# Patient Record
Sex: Male | Born: 1965 | Race: Black or African American | Hispanic: No | Marital: Married | Smoking: Never smoker
Health system: Southern US, Community
[De-identification: ages and names within clinical notes are randomized; demographics above are authoritative.]

## PROBLEM LIST (undated history)

## (undated) DIAGNOSIS — Z86010 Personal history of colonic polyps: Principal | ICD-10-CM

## (undated) DIAGNOSIS — M25562 Pain in left knee: Secondary | ICD-10-CM

## (undated) HISTORY — DX: Pain in left knee: M25.562

## (undated) HISTORY — DX: Personal history of colonic polyps: Z86.010

---

## 1996-10-08 HISTORY — PX: HEMORRHOID SURGERY: SHX153

## 2001-04-02 ENCOUNTER — Encounter: Admission: RE | Admit: 2001-04-02 | Discharge: 2001-04-02 | Payer: Self-pay | Admitting: Occupational Medicine

## 2001-04-02 ENCOUNTER — Encounter: Payer: Self-pay | Admitting: Occupational Medicine

## 2006-09-12 ENCOUNTER — Ambulatory Visit: Payer: Self-pay | Admitting: Family Medicine

## 2006-09-13 ENCOUNTER — Ambulatory Visit: Payer: Self-pay | Admitting: Family Medicine

## 2006-09-26 HISTORY — PX: VASECTOMY: SHX75

## 2006-09-27 ENCOUNTER — Ambulatory Visit: Payer: Self-pay | Admitting: Family Medicine

## 2006-09-27 ENCOUNTER — Encounter (INDEPENDENT_AMBULATORY_CARE_PROVIDER_SITE_OTHER): Payer: Self-pay | Admitting: Specialist

## 2006-10-25 ENCOUNTER — Ambulatory Visit: Payer: Self-pay | Admitting: Family Medicine

## 2007-06-12 ENCOUNTER — Ambulatory Visit: Payer: Self-pay | Admitting: Family Medicine

## 2007-06-12 LAB — CONVERTED CEMR LAB
ALT: 14 units/L (ref 0–53)
AST: 23 units/L (ref 0–37)
Albumin: 4.1 g/dL (ref 3.5–5.2)
Alkaline Phosphatase: 68 units/L (ref 39–117)
BUN: 11 mg/dL (ref 6–23)
Bilirubin, Direct: 0.2 mg/dL (ref 0.0–0.3)
CO2: 33 meq/L — ABNORMAL HIGH (ref 19–32)
Calcium: 9.8 mg/dL (ref 8.4–10.5)
Chloride: 107 meq/L (ref 96–112)
Cholesterol: 170 mg/dL (ref 0–200)
Creatinine, Ser: 1 mg/dL (ref 0.4–1.5)
GFR calc Af Amer: 105 mL/min
GFR calc non Af Amer: 87 mL/min
Glucose, Bld: 66 mg/dL — ABNORMAL LOW (ref 70–99)
HDL: 42.9 mg/dL (ref >39.0)
LDL Cholesterol: 115 mg/dL — ABNORMAL HIGH (ref 0–99)
Potassium: 3.6 meq/L (ref 3.5–5.1)
Sodium: 146 meq/L — ABNORMAL HIGH (ref 135–145)
TSH: 0.64 microintl units/mL (ref 0.35–5.50)
Total Bilirubin: 1.6 mg/dL — ABNORMAL HIGH (ref 0.3–1.2)
Total CHOL/HDL Ratio: 4
Total Protein: 7 g/dL (ref 6.0–8.3)
Triglycerides: 61 mg/dL (ref 0–149)
VLDL: 12 mg/dL (ref 0–40)

## 2008-06-17 ENCOUNTER — Ambulatory Visit: Payer: Self-pay | Admitting: Family Medicine

## 2008-06-17 DIAGNOSIS — H6120 Impacted cerumen, unspecified ear: Secondary | ICD-10-CM | POA: Insufficient documentation

## 2008-06-17 DIAGNOSIS — B070 Plantar wart: Secondary | ICD-10-CM | POA: Insufficient documentation

## 2008-06-18 LAB — CONVERTED CEMR LAB
BUN: 11 mg/dL (ref 6–23)
CO2: 31 meq/L (ref 19–32)
Calcium: 10 mg/dL (ref 8.4–10.5)
Chloride: 101 meq/L (ref 96–112)
Cholesterol: 170 mg/dL (ref 0–200)
Creatinine, Ser: 1.2 mg/dL (ref 0.4–1.5)
GFR calc Af Amer: 85 mL/min
GFR calc non Af Amer: 70 mL/min
Glucose, Bld: 81 mg/dL (ref 70–99)
HDL: 46.8 mg/dL (ref >39.0)
LDL Cholesterol: 109 mg/dL — ABNORMAL HIGH (ref 0–99)
PSA: 0.51 ng/mL (ref 0.10–4.00)
Potassium: 4.5 meq/L (ref 3.5–5.1)
Sodium: 137 meq/L (ref 135–145)
Total CHOL/HDL Ratio: 3.6
Triglycerides: 72 mg/dL (ref 0–149)
VLDL: 14 mg/dL (ref 0–40)

## 2009-06-30 ENCOUNTER — Ambulatory Visit: Payer: Self-pay | Admitting: Family Medicine

## 2009-06-30 LAB — CONVERTED CEMR LAB
ALT: 20 units/L (ref 0–53)
AST: 25 units/L (ref 0–37)
Albumin: 4.1 g/dL (ref 3.5–5.2)
Alkaline Phosphatase: 82 units/L (ref 39–117)
BUN: 15 mg/dL (ref 6–23)
Bilirubin, Direct: 0.1 mg/dL (ref 0.0–0.3)
CO2: 29 meq/L (ref 19–32)
Calcium: 9.2 mg/dL (ref 8.4–10.5)
Chloride: 104 meq/L (ref 96–112)
Cholesterol: 156 mg/dL (ref 0–200)
Creatinine, Ser: 1.2 mg/dL (ref 0.4–1.5)
GFR calc non Af Amer: 84.23 mL/min (ref >60)
Glucose, Bld: 68 mg/dL — ABNORMAL LOW (ref 70–99)
HDL: 39.2 mg/dL (ref >39.00)
LDL Cholesterol: 101 mg/dL — ABNORMAL HIGH (ref 0–99)
PSA: 0.47 ng/mL (ref 0.10–4.00)
Potassium: 3.3 meq/L — ABNORMAL LOW (ref 3.5–5.1)
Sodium: 140 meq/L (ref 135–145)
TSH: 0.88 microintl units/mL (ref 0.35–5.50)
Total Bilirubin: 1.4 mg/dL — ABNORMAL HIGH (ref 0.3–1.2)
Total CHOL/HDL Ratio: 4
Total Protein: 6.9 g/dL (ref 6.0–8.3)
Triglycerides: 80 mg/dL (ref 0.0–149.0)
VLDL: 16 mg/dL (ref 0.0–40.0)

## 2009-07-04 ENCOUNTER — Ambulatory Visit: Payer: Self-pay | Admitting: Family Medicine

## 2010-05-16 ENCOUNTER — Encounter (INDEPENDENT_AMBULATORY_CARE_PROVIDER_SITE_OTHER): Payer: Self-pay | Admitting: *Deleted

## 2010-06-27 ENCOUNTER — Telehealth (INDEPENDENT_AMBULATORY_CARE_PROVIDER_SITE_OTHER): Payer: Self-pay | Admitting: *Deleted

## 2010-06-29 ENCOUNTER — Ambulatory Visit: Payer: Self-pay | Admitting: Family Medicine

## 2010-06-30 LAB — CONVERTED CEMR LAB
Glucose, Bld: 75 mg/dL (ref 70–99)
PSA: 0.55 ng/mL (ref 0.10–4.00)

## 2010-07-17 ENCOUNTER — Ambulatory Visit: Payer: Self-pay | Admitting: Family Medicine

## 2010-07-17 DIAGNOSIS — M25569 Pain in unspecified knee: Secondary | ICD-10-CM | POA: Insufficient documentation

## 2010-11-09 NOTE — Assessment & Plan Note (Signed)
Summary: CPX/JRR   Vital Signs:  Patient profile:   45 year old male Weight:      146.25 pounds BMI:     21.68 Temp:     98.5 degrees F oral Pulse rate:   72 / minute Pulse rhythm:   regular BP sitting:   118 / 76  (left arm) Cuff size:   regular  Vitals Entered By: Sydell Axon LPN (July 17, 2010 2:39 PM) CC: 30 Minute checkup   History of Present Illness: CPE- see plan.  L knee pain.  New symptoms.  going on for about 2 weeks.  Got puffy a few days ago, down some now.  Pain with walking, pain on medial side.  No popping, clicking, no giving out.  Limited ROM- unable to completely flex the knee.  O/w with normal range of motion   Allergies: No Known Drug Allergies  Past History:  Past Surgical History: Last updated: 05/28/2007 Hemmorhoidectomy 1998 Vasectomy Hetty Ely) 09/26/06  Past Medical History: otherwise healthy  Family History: Reviewed history from 07/04/2009 and no changes required. Father: dec 2010 65  [place] Had Chemo Unknown type of Cancer  DM (7/09)--CVA, ETOH abuse Mother: dec 62 Ca, bone  (7/08) Sister, little contact, Asthma CV - none HBP - none DM - none Breast/Ovarian/Uterine Cancer - none Depression - none ETOH Abuse + Father Stroke - none  Social History: Reviewed history from 07/04/2009 and no changes required. 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  Otherwise negative.    Physical Exam  General:  GEN: nad, alert and oriented HEENT: mucous membranes moist NECK: supple w/o LA CV: rrr.  no murmur PULM: ctab, no inc wob ABD: soft, +bs EXT: no edema SKIN: no acute rash  Rectal exam with heme neg stool, normal prostate exam- not tender to palpation, no asymmetry, no nodularity L Knee: slighly puffy, no erythema, normal ext, flex  slightly limited by pain.  tender to palpation medially, no tender to palpation laterally. ACL/MCL/LCL/PCL intact.  no meniscal findings.  normal patellar tracking w/o pain on compression   Impression & Recommendations:  Problem # 1:  HEALTH MAINTENANCE EXAM (ICD-V70.0) d/w patient.  flu shot today.  tdap up to date. d/w patient [id number] diet and exercise.  lipids wnl last year.  won't need PSA/rectal until 48 unless symptoms develop.   Problem # 2:  KNEE PAIN, LEFT (ICD-719.46) Likely OA flare w/o acute abnormality that would need imaging today.  Able to bear weight.  Would use otc pain meds and a knee sleeve.  Pt to follow this and follow up here if continued/increased.  He agrees.    Other Orders: Admin 1st Vaccine (40981) Flu Vaccine 37yrs + [id number])  Patient Instructions: 1)  Glad to see you today.   2)  I would get a knee sleeve at the grocery store or pharmacy.  Wear it during the day.  Take 2 regular tylenol three times a day as needed for pain.  Let me know if the swelling or pain doesn't get better.   3)  Take care.   Prior Medications (reviewed today): None Current Allergies (reviewed today): No known allergies    Flu Vaccine Consent Questions     Do you have a history of severe allergic reactions to this vaccine? no  Any prior history of allergic reactions to egg and/or gelatin? no    Do you have a sensitivity to the preservative Thimersol? no    Do you have a past history of Guillan-Barre Syndrome? no    Do you currently have an acute febrile illness? no    Have you ever had a severe reaction to latex? no    Vaccine information given and explained to patient? yes    Are you currently pregnant? no    Lot Number:[id number]   Exp Date:04/07/2011   Site Given  Left Deltoid IMlbflu

## 2010-11-09 NOTE — Progress Notes (Signed)
----   Converted from flag ---- ---- 06/26/2010 1:16 PM, Crawford Givens MD wrote: glucose (v70.0) and PSA (v76.44)  ---- 06/26/2010 12:17 PM, Liane Comber CMA (AAMA) wrote: Lab orders please! Good Morning! This pt is scheduled for cpx labs [place], which labs to draw and dx codes to use? Thanks Tasha ------------------------------

## 2010-11-09 NOTE — Letter (Signed)
Summary: Nadara Eaton letter  [facility]  [address] [place], [place] [address]   Phone: [phone]  Fax: [phone]       05/16/2010 MRN: [id number]  [facility] [address] [place], [place]  [address]  Dear Mr. Cataldi,  [facility] [place], and [place] announce the retirement of Arta Silence, M.D., from full-time practice at the [facility] office effective April 06, 2010 and his plans of returning part-time.  It is important to Dr. Hetty Ely and to our practice that you understand that [facility] has seven physicians in our office for your health care needs.  We will continue to offer the same exceptional care that you have today.    Dr. Hetty Ely has spoken to many of you about his plans for retirement and returning part-time in the fall.   We will continue to work with you through the transition to schedule appointments for you in the office and meet the high standards that [place] is committed to.   Again, it is with great pleasure that we share the news that Dr. Hetty Ely will return to [facility] in October of 2011 with a reduced schedule.    If you have any questions, or would like to request an appointment with one of our physicians, please call us at [phone] and press the option for Scheduling an appointment.  We take pleasure in providing you with excellent patient care and look forward to seeing you at your next office visit.  Our [facility] Physicians are:  Tillman Abide, M.D. Laurita Quint, M.D. Roxy Manns, M.D. Kerby Nora, M.D. Hannah Beat, M.D. Ruthe Mannan, M.D. We proudly welcomed Raechel Ache, M.D. and Eustaquio Boyden, M.D. to the practice in July/August 2011.  Sincerely,  [facility]

## 2010-12-22 ENCOUNTER — Ambulatory Visit (INDEPENDENT_AMBULATORY_CARE_PROVIDER_SITE_OTHER)
Admission: RE | Admit: 2010-12-22 | Discharge: 2010-12-22 | Disposition: A | Discharge status: Home / Self Care | Payer: Managed Care, Other (non HMO) | Source: Ambulatory Visit | Attending: Family Medicine | Admitting: Family Medicine

## 2010-12-22 ENCOUNTER — Other Ambulatory Visit: Payer: Self-pay | Admitting: Family Medicine

## 2010-12-22 ENCOUNTER — Encounter: Payer: Self-pay | Admitting: Family Medicine

## 2010-12-22 ENCOUNTER — Ambulatory Visit (INDEPENDENT_AMBULATORY_CARE_PROVIDER_SITE_OTHER): Payer: Managed Care, Other (non HMO) | Admitting: Family Medicine

## 2010-12-22 DIAGNOSIS — M25569 Pain in unspecified knee: Secondary | ICD-10-CM

## 2010-12-26 NOTE — Letter (Signed)
Summary: Generic Letter  [facility]  [address] [place], [place] [address]   Phone: [phone]  Fax: [phone]    12/22/2010  MITCHAEL LUCKEY [address] [place], [place]  [address]  [place]  To whom it may concern,  Please allow the patient to work without wearing steeltoe boots.     Sincerely,   Crawford Givens MD

## 2011-01-04 NOTE — Assessment & Plan Note (Signed)
Summary: KNEE PAIN/CLE  [employer]   Vital Signs:  Patient profile:   45 year old male Height:      69 inches Weight:      151.25 pounds BMI:     22.42 Temp:     98.3 degrees F oral Pulse rate:   68 / minute Pulse rhythm:   regular BP sitting:   116 / 76  (left arm) Cuff size:   regular  Vitals Entered By: Delilah Shan CMA Duncan Dull) (December 22, 2010 9:39 AM) CC: Knee pain   History of Present Illness: L knee pain- he got some better with a knee sleeve but the pain returned.  Pain started getting worse in last month.  No trauma, no trigger known.  Pain worse at night, early AM and after getting up from sitting.  Sitting for brief time helps with the pain.   It had been puffy prev.  No locking, no popping.  Trouble with full flexion.  Minimal help with aleve.    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  Otherwise negative.    Physical Exam  General:  no apparent distress L knee with normal inspection, not puffy or bruised.  normal range of motion but pain with extreme of flexion.  tender to palpation on medial joint line.  no patellar clicks.  ligamentously intact.  not tender to palpation on lateral joint line.    Impression & Recommendations:  Problem # 1:  KNEE PAIN, LEFT (ICD-719.46) d/w patient.  Likely OA flare.  No neniscal click or locking.  I would treat with tramadol and if not improved, he'll call back and we'll set up ortho eval.  He agrees.  Orders: T-Knee Left 2 view (73560TC) Prescription Created Electronically [phone])  His updated medication list for this problem includes:    Tramadol Hcl 50 Mg Tabs (Tramadol hcl) .Marland Kitchen... 1 by mouth three times a day as needed for pain  Complete Medication List: 1)  Tramadol Hcl 50  Mg Tabs (Tramadol hcl) .Marland Kitchen.. 1 by mouth three times a day as needed for pain  Patient Instructions: 1)  Take the tramadol three times a day as needed for pain.  It can make you drowsy.  Let me know if it doesn't help with the pain.  If you aren't improved, then we may need to get you set up with the ortho clinic.  Take care.   Prescriptions: TRAMADOL HCL 50 MG TABS (TRAMADOL HCL) 1 by mouth three times a day as needed for pain  #40 x 2   Entered and Authorized by:   Crawford Givens MD   Signed by:   Crawford Givens MD on 12/22/2010   Method used:   Electronically to        [facility]* (retail)       [address]       [place], [place]  [address]       Ph: [phone]       Fax: [phone]   RxID:   [id number]    Orders Added: 1)  T-Knee Left 2 view [73560TC] 2)  Est. Patient Level III [56387] 3)  Prescription Created Electronically [phone]    Prior Medications (reviewed today): None Current Allergies (reviewed today): No known  allergies

## 2011-07-13 ENCOUNTER — Other Ambulatory Visit: Payer: Self-pay | Admitting: Family Medicine

## 2011-07-17 ENCOUNTER — Other Ambulatory Visit: Payer: Managed Care, Other (non HMO)

## 2011-07-17 ENCOUNTER — Other Ambulatory Visit (INDEPENDENT_AMBULATORY_CARE_PROVIDER_SITE_OTHER): Payer: BC Managed Care – PPO

## 2011-07-17 DIAGNOSIS — R7309 Other abnormal glucose: Secondary | ICD-10-CM

## 2011-07-18 ENCOUNTER — Encounter: Payer: Self-pay | Admitting: Family Medicine

## 2011-07-26 ENCOUNTER — Ambulatory Visit (INDEPENDENT_AMBULATORY_CARE_PROVIDER_SITE_OTHER): Payer: BC Managed Care – PPO | Admitting: Family Medicine

## 2011-07-26 ENCOUNTER — Encounter: Payer: Self-pay | Admitting: Family Medicine

## 2011-07-26 VITALS — BP 106/80 | HR 64 | Temp 97.8°F | Wt 150.1 lb

## 2011-07-26 DIAGNOSIS — Z0000 Encounter for general adult medical examination without abnormal findings: Secondary | ICD-10-CM

## 2011-07-26 DIAGNOSIS — Z23 Encounter for immunization: Secondary | ICD-10-CM

## 2011-07-26 NOTE — Progress Notes (Signed)
CPE- See plan.  Routine anticipatory guidance given to patient.  See health maintenance.  PMH and SH reviewed  Meds, vitals, and allergies reviewed.   ROS: See HPI.  Otherwise negative.    GEN: nad, alert and oriented HEENT: mucous membranes moist NECK: supple w/o LA CV: rrr. PULM: ctab, no inc wob ABD: soft, +bs EXT: no edema SKIN: no acute rash

## 2011-07-26 NOTE — Patient Instructions (Signed)
Keep exercising and take care.  Glad to see you.  Let me know if you have any concerns.  I would get another flu shot next fall at a physical.

## 2011-07-27 DIAGNOSIS — Z0000 Encounter for general adult medical examination without abnormal findings: Secondary | ICD-10-CM | POA: Insufficient documentation

## 2011-07-27 NOTE — Assessment & Plan Note (Signed)
Doing well.  Healthy habits encouraged.  Flu shot done today.  Sugar wnl.  No need for early prostate/colon cancer screening.  He agrees.  F/u prn.

## 2012-04-17 ENCOUNTER — Ambulatory Visit (INDEPENDENT_AMBULATORY_CARE_PROVIDER_SITE_OTHER): Payer: BC Managed Care – PPO | Admitting: Family Medicine

## 2012-04-17 ENCOUNTER — Encounter: Payer: Self-pay | Admitting: Family Medicine

## 2012-04-17 VITALS — BP 116/72 | HR 64 | Temp 97.9°F | Ht 68.75 in | Wt 150.8 lb

## 2012-04-17 DIAGNOSIS — M542 Cervicalgia: Secondary | ICD-10-CM

## 2012-04-17 NOTE — Assessment & Plan Note (Signed)
Likely muscle strain, no need to image.  Anatomy d/w pt, reassured about normal spinous process.  Use topical capsaicin, stretch and Gi caution on aleve.  F/u prn.

## 2012-04-17 NOTE — Patient Instructions (Addendum)
Check your pillow, stretch your neck and use either the cream or the heating pad.  This should get better.  Take aleve with food.

## 2012-04-17 NOTE — Progress Notes (Signed)
Started about 2 weeks ago. Had been doing some heavy lifting at work.  Pain gradually got worse.  He didn't remember a specific injury.  No pain now, but "it has it's moments."  Pain with certain movements.  Capsaicin helps some along with aleve.  He noted a bump on the back of his neck  Meds, vitals, and allergies reviewed.   ROS: See HPI.  Otherwise, noncontributory.  nad ncat Neck supple, normal ROM rrr ctab Neck and back not ttp and the bump he noted is the spinous process of C7.  No rash Normal rom in the shoulders

## 2012-05-19 IMAGING — CR DG KNEE 1-2V*L*
3 series · 3 of 3 positions shown · non-contrast
Comparison: None.

CLINICAL DATA: Left knee pain for 1 month.  No history of injury.

LEFT KNEE - 1-2 VIEW

[view not recorded (1 of 3)]
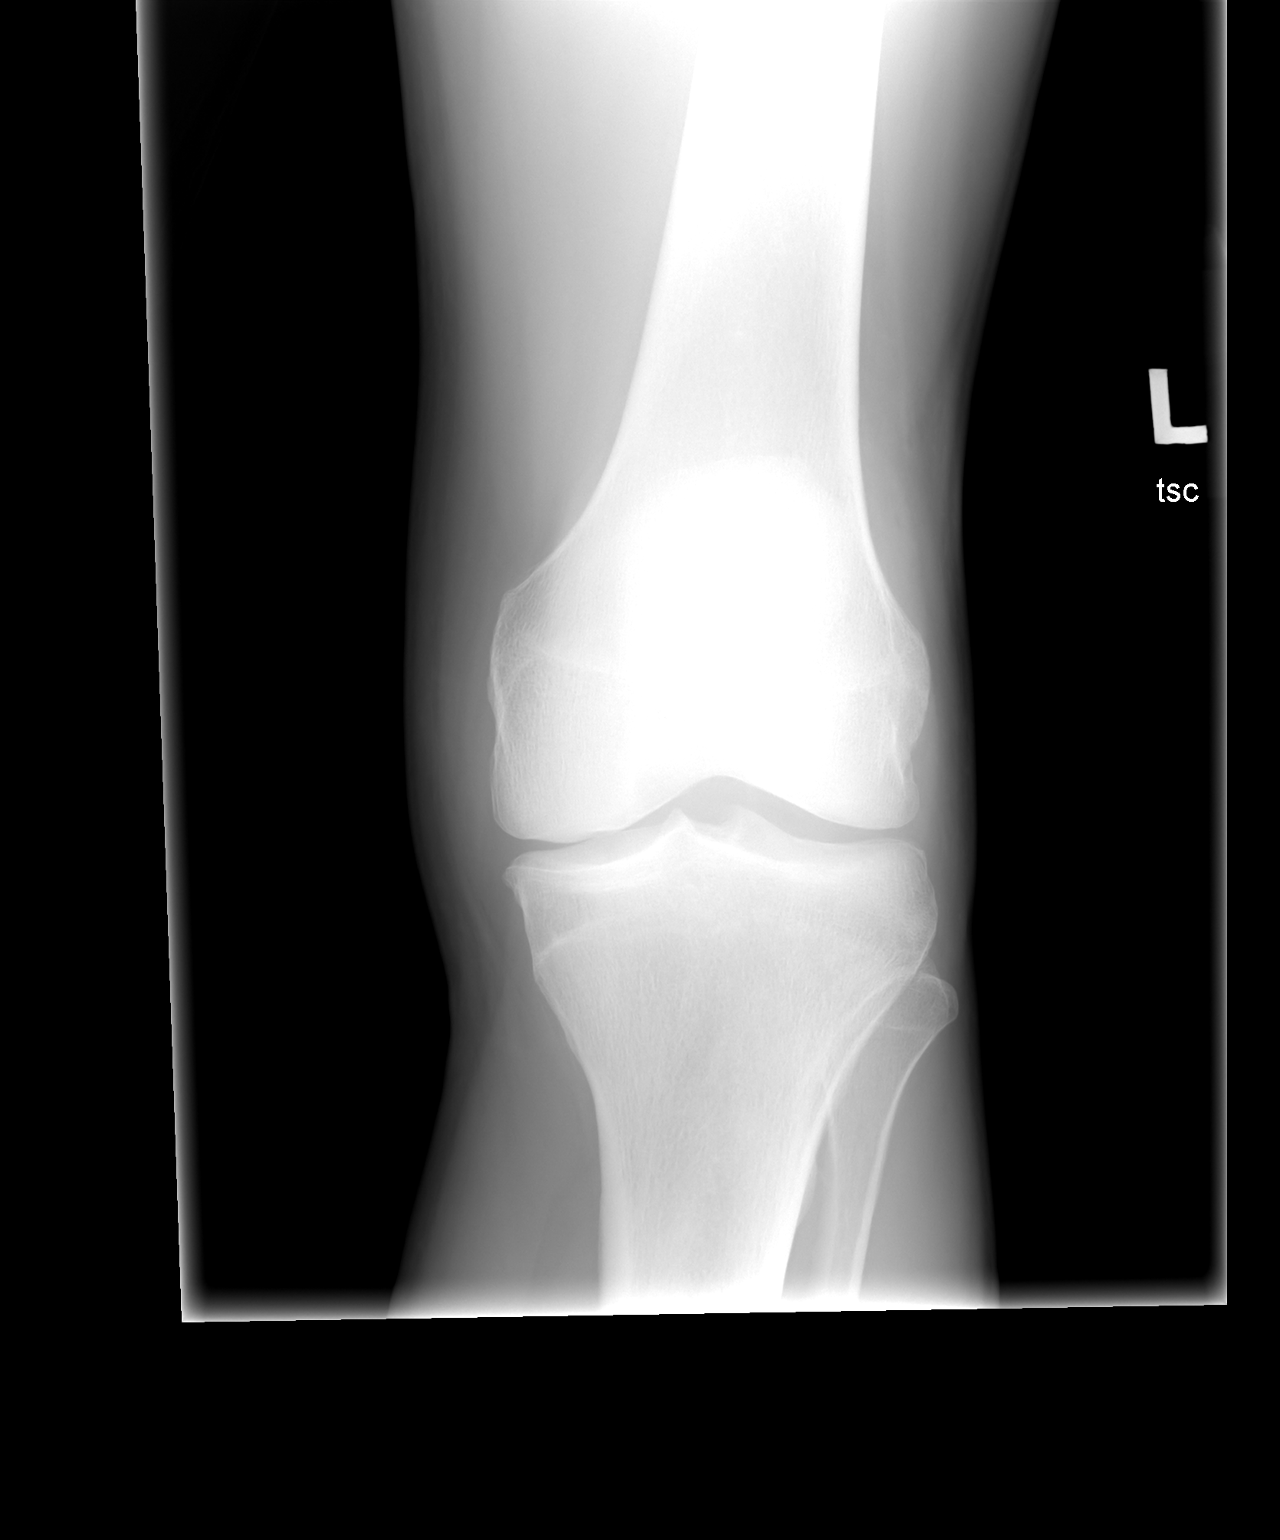

[view not recorded (2 of 3)]
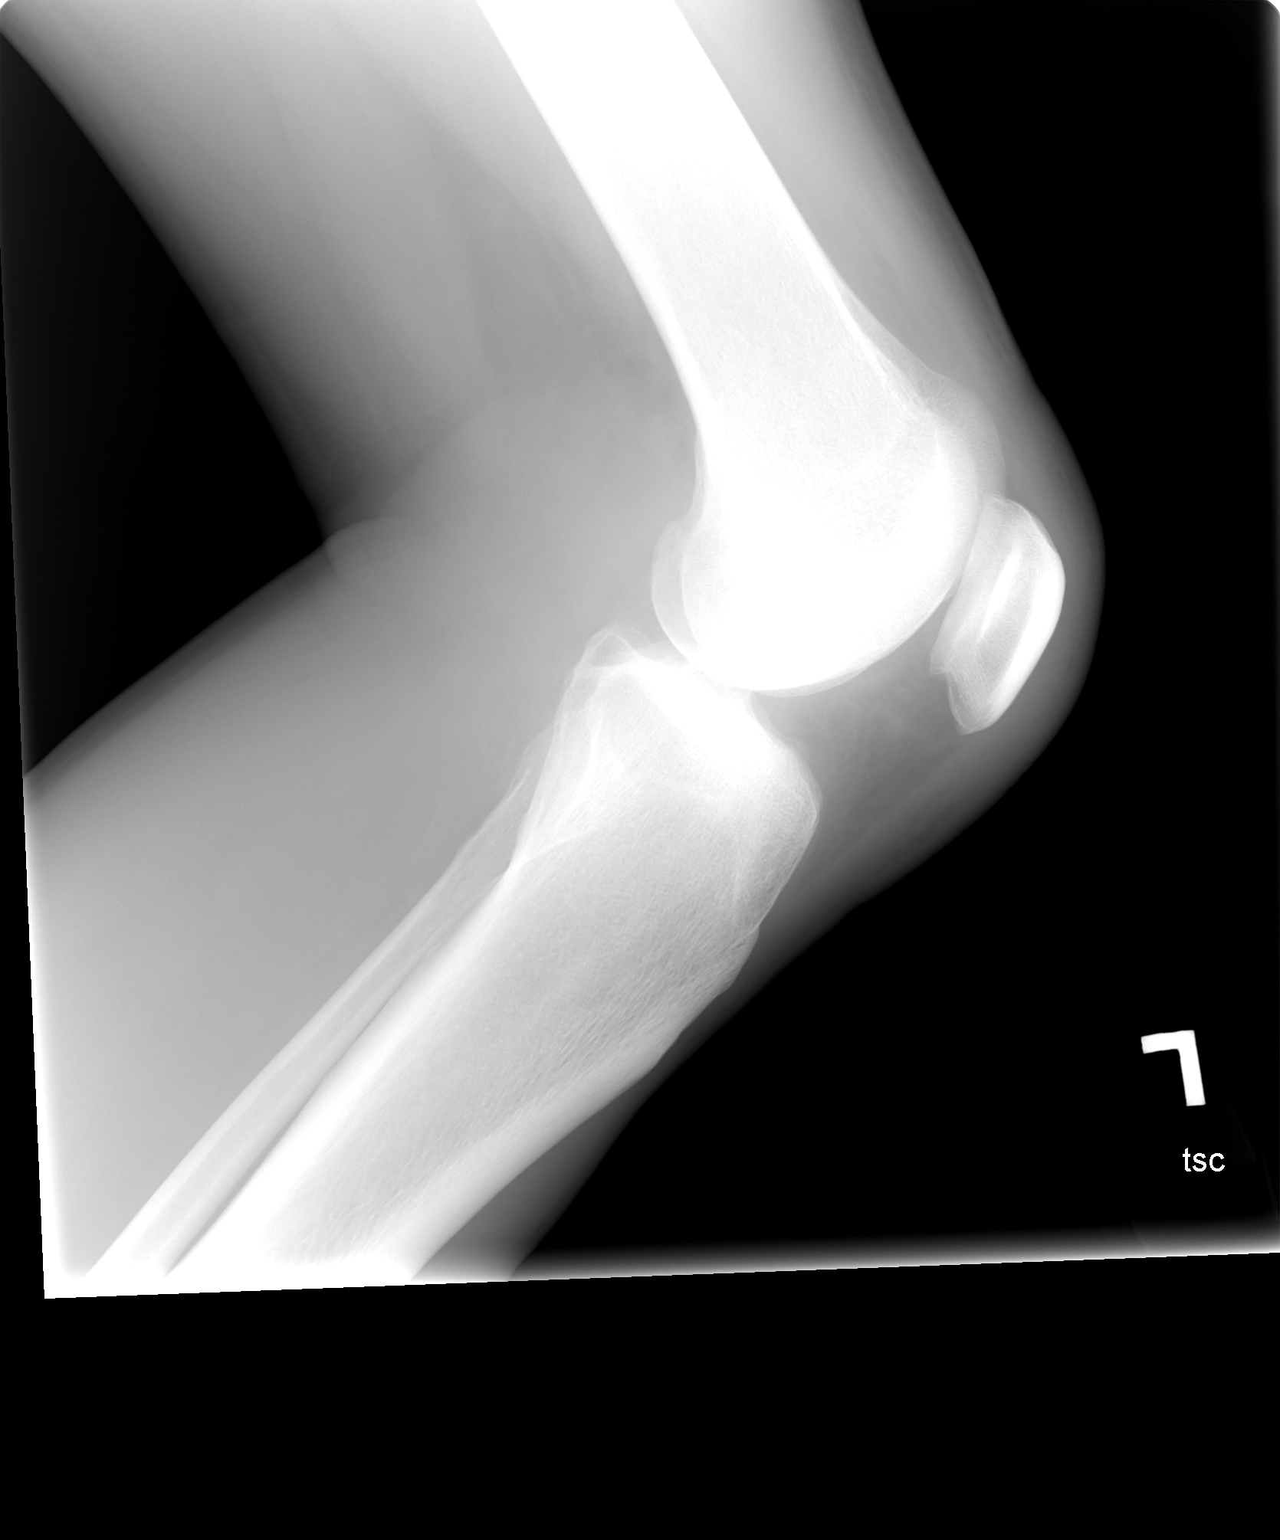

[view not recorded (3 of 3)]
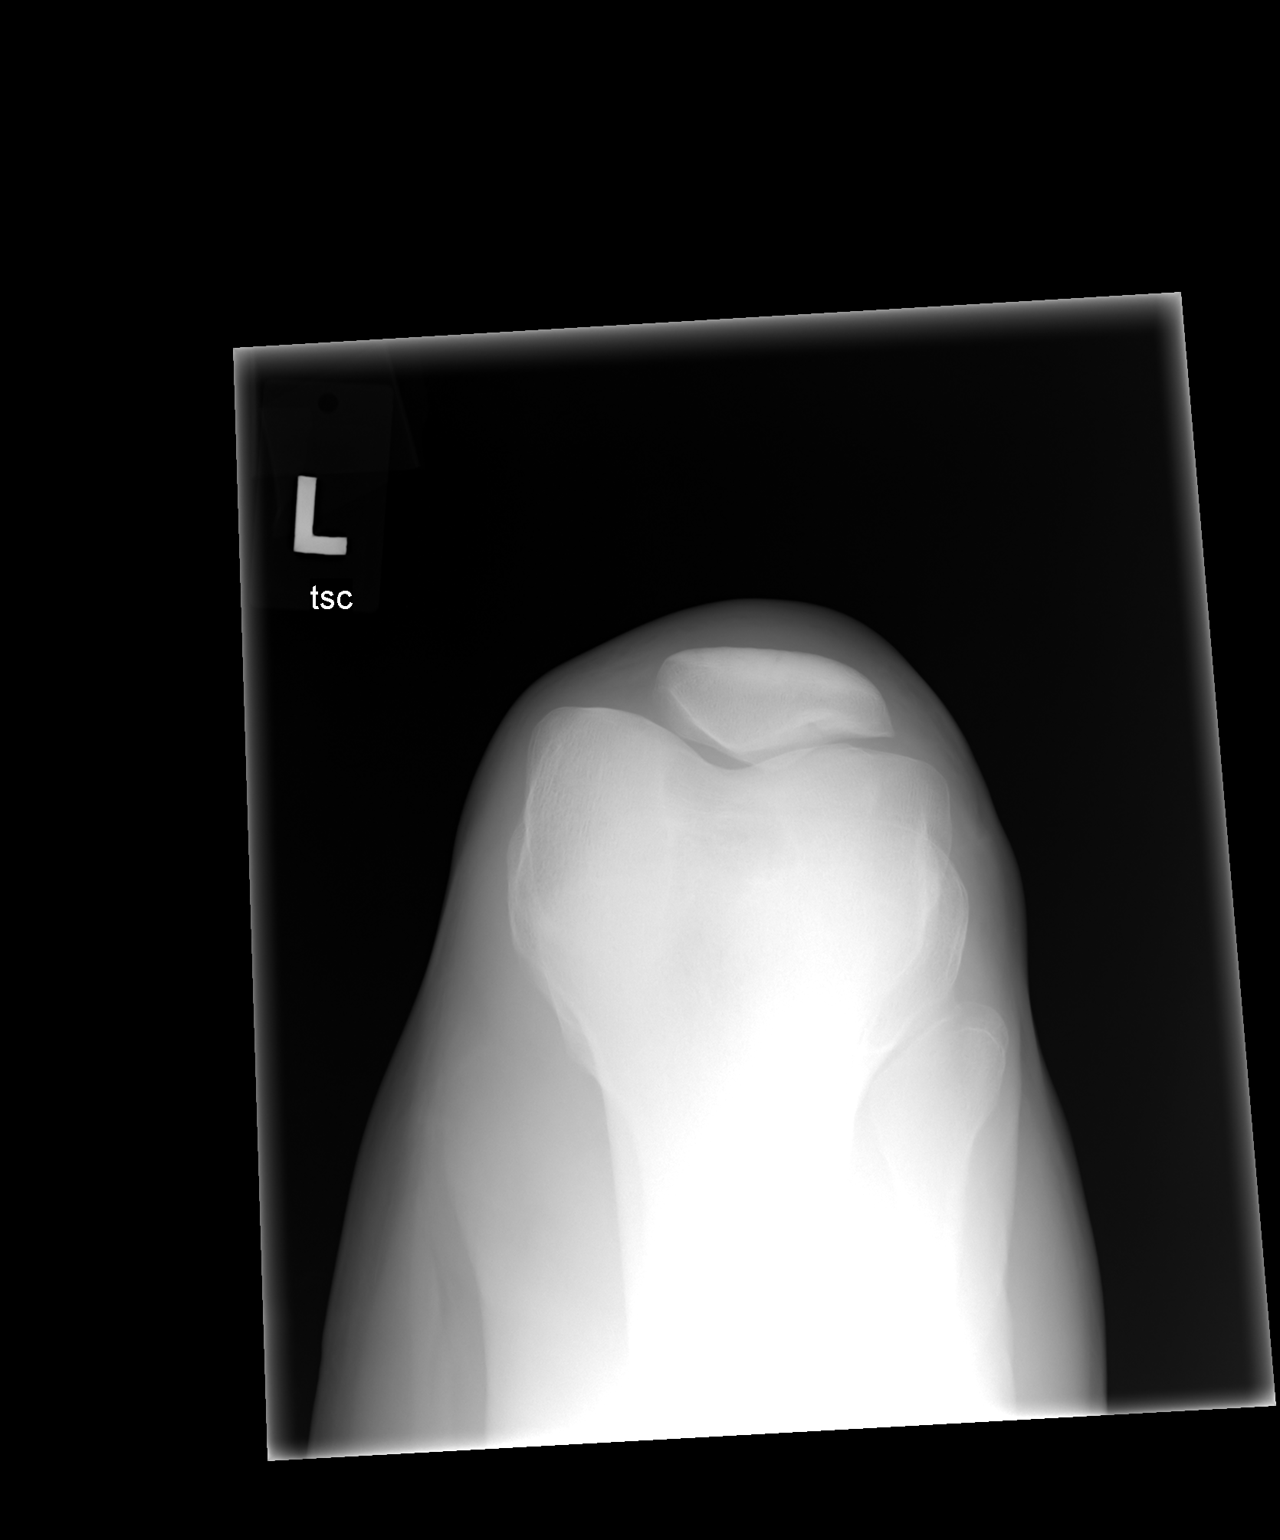

[3 of 3 positions shown; findings below may reference images not displayed]

FINDINGS: No joint effusion is evident. Alignment is normal.  Joint
spaces are preserved.  No fracture or dislocation is evident.  No
soft tissue lesions are seen.  No chondrocalcinosis is seen.
IMPRESSION: No left knee abnormality is demonstrated.

## 2012-07-24 ENCOUNTER — Other Ambulatory Visit: Payer: BC Managed Care – PPO

## 2012-07-29 ENCOUNTER — Ambulatory Visit (INDEPENDENT_AMBULATORY_CARE_PROVIDER_SITE_OTHER): Payer: BC Managed Care – PPO | Admitting: Family Medicine

## 2012-07-29 ENCOUNTER — Encounter: Payer: Self-pay | Admitting: Family Medicine

## 2012-07-29 VITALS — BP 102/68 | HR 75 | Temp 98.1°F | Ht 69.0 in | Wt 149.8 lb

## 2012-07-29 DIAGNOSIS — Z0000 Encounter for general adult medical examination without abnormal findings: Secondary | ICD-10-CM

## 2012-07-29 DIAGNOSIS — Z833 Family history of diabetes mellitus: Secondary | ICD-10-CM

## 2012-07-29 DIAGNOSIS — Z23 Encounter for immunization: Secondary | ICD-10-CM

## 2012-07-29 NOTE — Assessment & Plan Note (Signed)
Routine anticipatory guidance given to patient.  See health maintenance. Tetanus 2009 Flu shot done 2013 No need for early prostate/colon cancer screening, d/w pt about this and he agreed.  Sugar checked today to screen for DM2- normal.  Lipids prev wnl, discussed.  BP wnl.  Diet and exercise discussed.  Living will encouraged, pt to consider.

## 2012-07-29 NOTE — Patient Instructions (Signed)
Take care.  Glad to see you.  I would get flu shot again next fall.

## 2012-07-29 NOTE — Progress Notes (Signed)
CPE- See plan.  Routine anticipatory guidance given to patient.  See health maintenance. Tetanus 2009 Flu shot done 2013 No need for early prostate/colon cancer screening, d/w pt about this and he agreed.  Sugar checked today to screen for DM2- normal.  Lipids prev wnl, discussed.  BP wnl.  Diet and exercise discussed.  Living will encouraged, pt to consider.   PMH and SH reviewed  Meds, vitals, and allergies reviewed.   ROS: See HPI.  Otherwise negative.    GEN: nad, alert and oriented HEENT: mucous membranes moist NECK: supple w/o LA CV: rrr. PULM: ctab, no inc wob ABD: soft, +bs EXT: no edema SKIN: no acute rash

## 2012-10-30 ENCOUNTER — Ambulatory Visit: Payer: BC Managed Care – PPO | Admitting: Family Medicine

## 2013-08-09 ENCOUNTER — Other Ambulatory Visit: Payer: Self-pay | Admitting: Family Medicine

## 2013-08-09 DIAGNOSIS — Z131 Encounter for screening for diabetes mellitus: Secondary | ICD-10-CM

## 2013-08-10 ENCOUNTER — Other Ambulatory Visit: Payer: Self-pay | Admitting: Family Medicine

## 2013-08-11 ENCOUNTER — Other Ambulatory Visit (INDEPENDENT_AMBULATORY_CARE_PROVIDER_SITE_OTHER): Payer: BC Managed Care – PPO

## 2013-08-11 DIAGNOSIS — Z131 Encounter for screening for diabetes mellitus: Secondary | ICD-10-CM

## 2013-08-20 ENCOUNTER — Ambulatory Visit (INDEPENDENT_AMBULATORY_CARE_PROVIDER_SITE_OTHER): Payer: BC Managed Care – PPO | Admitting: Internal Medicine

## 2013-08-20 ENCOUNTER — Encounter: Payer: Self-pay | Admitting: Internal Medicine

## 2013-08-20 VITALS — BP 108/68 | HR 60 | Temp 98.2°F | Ht 68.25 in | Wt 149.0 lb

## 2013-08-20 DIAGNOSIS — Z23 Encounter for immunization: Secondary | ICD-10-CM

## 2013-08-20 DIAGNOSIS — Z0000 Encounter for general adult medical examination without abnormal findings: Secondary | ICD-10-CM

## 2013-08-20 NOTE — Progress Notes (Signed)
Subjective:    Patient ID: Greg Anes., male    DOB: 02/16/1966, 47 y.o.   MRN: [id number]  HPI  Pt presents to the clinic today for his annual exam. He has no concerns today.  Flu: Today Tetanus: 2009 Eye Doctor: yearly Dentist: yearly  Review of Systems      Past Medical History  Diagnosis Date  . Knee pain, left     prev responsive to tramadol    No current outpatient prescriptions on file.   No current facility-administered medications for this visit.    No Known Allergies  Family History  Problem Relation Age of Onset  . Cancer Mother     bone, (7/08)  . Cancer Father     unknown type of CA, DM, CVA, ETOH abuse  . Diabetes Father     7/09  . Stroke Father   . Alcohol abuse Father   . Asthma Sister   . Heart disease Neg Hx   . Hypertension Neg Hx   . Depression Neg Hx   . Prostate cancer Neg Hx   . Colon cancer Neg Hx     History   Social History  . Marital Status: Married    Spouse Name: N/A    Number of Children: 1  . Years of Education: N/A   Occupational History  . [employer] conditioning units and ductwork, eqpt.   Social History Main Topics  . Smoking status: Never Smoker   . Smokeless tobacco: Never Used  . Alcohol Use: Yes     Comment: Rare  . Drug Use: No  . Sexual Activity: Not on file   Other Topics Concern  . Not on file   Social History Narrative   Married 1994   One son at home   Exercise:  At work (at [employer] and air)   Enjoys watching car races (Therapist, sports), car shows     Constitutional: Denies fever, malaise, fatigue, headache or abrupt weight changes.  HEENT: Denies eye pain, eye redness, ear pain, ringing in the ears, wax buildup, runny nose, nasal congestion, bloody nose, or sore throat. Respiratory: Denies difficulty breathing, shortness of breath, cough or sputum production.   Cardiovascular: Denies chest pain, chest tightness, palpitations or swelling in the hands or feet.   Gastrointestinal: Denies abdominal pain, bloating, constipation, diarrhea or blood in the stool.  GU: Denies urgency, frequency, pain with urination, burning sensation, blood in urine, odor or discharge. Musculoskeletal: Denies decrease in range of motion, difficulty with gait, muscle pain or joint pain and swelling.  Skin: Denies redness, rashes, lesions or ulcercations.  Neurological: Denies dizziness, difficulty with memory, difficulty with speech or problems with balance and coordination.   No other specific complaints in a complete review of systems (except as listed in HPI above).  Objective:   Physical Exam  BP 108/68  Pulse 60  Temp(Src) 98.2 F (36.8 C) (Oral)  Ht 5' 8.25" (1.734 m)  Wt 149 lb (67.586 kg)  BMI 22.48 kg/m2  SpO2 98% Wt Readings from Last 3 Encounters:  08/20/13 149 lb (67.586 kg)  07/29/12 149 lb 12 oz (67.926 kg)  04/17/12 150 lb 12 oz (68.38 kg)    General: Appears his stated age, well developed, well nourished in NAD. Skin: Warm, dry and intact. No rashes, lesions or ulcerations noted. HEENT: Head: normal shape and size; Eyes: sclera white, no icterus, conjunctiva pink, PERRLA and EOMs intact; Ears: Tm's  gray and intact, normal light reflex; Nose: mucosa pink and moist, septum midline; Throat/Mouth: Teeth present, mucosa pink and moist, no exudate, lesions or ulcerations noted.  Neck: Normal range of motion. Neck supple, trachea midline. No massses, lumps or thyromegaly present.  Cardiovascular: Normal rate and rhythm. S1,S2 noted.  No murmur, rubs or gallops noted. No JVD or BLE edema. No carotid bruits noted. Pulmonary/Chest: Normal effort and positive vesicular breath sounds. No respiratory distress. No wheezes, rales or ronchi noted.  Abdomen: Soft and nontender. Normal bowel sounds, no bruits noted. No distention or masses noted. Liver, spleen and kidneys non palpable. Musculoskeletal: Normal range of motion. No signs of joint swelling. No difficulty  with gait.  Neurological: Alert and oriented. Cranial nerves II-XII intact. Coordination normal. +DTRs bilaterally. Psychiatric: Mood and affect normal. Behavior is normal. Judgment and thought content normal.    BMET    Component Value Date/Time   NA 140 06/30/2009 0846   K 3.3* 06/30/2009 0846   CL 104 06/30/2009 0846   CO2 29 06/30/2009 0846   GLUCOSE 59* 08/11/2013 0811   BUN 15 06/30/2009 0846   CREATININE 1.2 06/30/2009 0846   CALCIUM 9.2 06/30/2009 0846   GFRNONAA 84.23 06/30/2009 0846   GFRAA 85 06/17/2008 0943    Lipid Panel     Component Value Date/Time   CHOL 156 06/30/2009 0846   TRIG 80.0 06/30/2009 0846   HDL 39.20 06/30/2009 0846   CHOLHDL 4 06/30/2009 0846   VLDL 16.0 06/30/2009 0846   LDLCALC 101* 06/30/2009 0846          Assessment & Plan:   Preventative Health Maintenance:  Labs reviewed- glucose normal Will do a full lab panel next year Flu shot today  RTC in 1 year or sooner if needed

## 2013-08-20 NOTE — Patient Instructions (Signed)
Health Maintenance, Males A healthy lifestyle and preventative care can promote health and wellness.  Maintain regular health, dental, and eye exams.  Eat a healthy diet. Foods like vegetables, fruits, whole grains, low-fat dairy products, and lean protein foods contain the nutrients you need without too many calories. Decrease your intake of foods high in solid fats, added sugars, and salt. Get information about a proper diet from your caregiver, if necessary.  Regular physical exercise is one of the most important things you can do for your health. Most adults should get at least 150 minutes of moderate-intensity exercise (any activity that increases your heart rate and causes you to sweat) each week. In addition, most adults need muscle-strengthening exercises on 2 or more days a week.   Maintain a healthy weight. The body mass index (BMI) is a screening tool to identify possible weight problems. It provides an estimate of body fat based on height and weight. Your caregiver can help determine your BMI, and can help you achieve or maintain a healthy weight. For adults 20 years and older:  A BMI below 18.5 is considered underweight.  A BMI of 18.5 to 24.9 is normal.  A BMI of 25 to 29.9 is considered overweight.  A BMI of 30 and above is considered obese.  Maintain normal blood lipids and cholesterol by exercising and minimizing your intake of saturated fat. Eat a balanced diet with plenty of fruits and vegetables. Blood tests for lipids and cholesterol should begin at age 20 and be repeated every 5 years. If your lipid or cholesterol levels are high, you are over 50, or you are a high risk for heart disease, you may need your cholesterol levels checked more frequently.Ongoing high lipid and cholesterol levels should be treated with medicines, if diet and exercise are not effective.  If you smoke, find out from your caregiver how to quit. If you do not use tobacco, do not start.  Lung  cancer screening is recommended for adults aged 55 80 years who are at high risk for developing lung cancer because of a history of smoking. Yearly low-dose computed tomography (CT) is recommended for people who have at least a 30-pack-year history of smoking and are a current smoker or have quit within the past 15 years. A pack year of smoking is smoking an average of 1 pack of cigarettes a day for 1 year (for example: 1 pack a day for 30 years or 2 packs a day for 15 years). Yearly screening should continue until the smoker has stopped smoking for at least 15 years. Yearly screening should also be stopped for people who develop a health problem that would prevent them from having lung cancer treatment.  If you choose to drink alcohol, do not exceed 2 drinks per day. One drink is considered to be 12 ounces (355 mL) of beer, 5 ounces (148 mL) of wine, or 1.5 ounces (44 mL) of liquor.  Avoid use of street drugs. Do not share needles with anyone. Ask for help if you need support or instructions about stopping the use of drugs.  High blood pressure causes heart disease and increases the risk of stroke. Blood pressure should be checked at least every 1 to 2 years. Ongoing high blood pressure should be treated with medicines if weight loss and exercise are not effective.  If you are 45 to 49 years old, ask your caregiver if you should take aspirin to prevent heart disease.  Diabetes screening involves taking a blood   sample to check your fasting blood sugar level. This should be done once every 3 years, after age 45, if you are within normal weight and without risk factors for diabetes. Testing should be considered at a younger age or be carried out more frequently if you are overweight and have at least 1 risk factor for diabetes.  Colorectal cancer can be detected and often prevented. Most routine colorectal cancer screening begins at the age of 50 and continues through age 75. However, your caregiver may  recommend screening at an earlier age if you have risk factors for colon cancer. On a yearly basis, your caregiver may provide home test kits to check for hidden blood in the stool. Use of a small camera at the end of a tube, to directly examine the colon (sigmoidoscopy or colonoscopy), can detect the earliest forms of colorectal cancer. Talk to your caregiver about this at age 50, when routine screening begins. Direct examination of the colon should be repeated every 5 to 10 years through age 75, unless early forms of pre-cancerous polyps or small growths are found.  Hepatitis C blood testing is recommended for all people born from 1945 through 1965 and any individual with known risks for hepatitis C.  Healthy men should no longer receive prostate-specific antigen (PSA) blood tests as part of routine cancer screening. Consult with your caregiver about prostate cancer screening.  Testicular cancer screening is not recommended for adolescents or adult males who have no symptoms. Screening includes self-exam, caregiver exam, and other screening tests. Consult with your caregiver about any symptoms you have or any concerns you have about testicular cancer.  Practice safe sex. Use condoms and avoid high-risk sexual practices to reduce the spread of sexually transmitted infections (STIs).  Use sunscreen. Apply sunscreen liberally and repeatedly throughout the day. You should seek shade when your shadow is shorter than you. Protect yourself by wearing long sleeves, pants, a wide-brimmed hat, and sunglasses year round, whenever you are outdoors.  Notify your caregiver of new moles or changes in moles, especially if there is a change in shape or color. Also notify your caregiver if a mole is larger than the size of a pencil eraser.  A one-time screening for abdominal aortic aneurysm (AAA) and surgical repair of large AAAs by sound wave imaging (ultrasonography) is recommended for ages 65 to 75 years who are  current or former smokers.  Stay current with your immunizations. Document Released: 03/22/2008 Document Revised: 01/19/2013 Document Reviewed: 02/19/2011 Exit[employer] Patient Information 2014 [employer].  

## 2013-08-20 NOTE — Progress Notes (Signed)
Pre-visit discussion using our clinic review tool. No additional management support is needed unless otherwise documented below in the visit note.  

## 2014-05-07 ENCOUNTER — Ambulatory Visit (INDEPENDENT_AMBULATORY_CARE_PROVIDER_SITE_OTHER): Payer: BC Managed Care – PPO | Admitting: Family Medicine

## 2014-05-07 ENCOUNTER — Encounter: Payer: Self-pay | Admitting: Family Medicine

## 2014-05-07 ENCOUNTER — Ambulatory Visit (INDEPENDENT_AMBULATORY_CARE_PROVIDER_SITE_OTHER)
Admission: RE | Admit: 2014-05-07 | Discharge: 2014-05-07 | Disposition: A | Discharge status: Home / Self Care | Payer: BC Managed Care – PPO | Source: Ambulatory Visit | Attending: Family Medicine | Admitting: Family Medicine

## 2014-05-07 VITALS — BP 114/68 | HR 69 | Temp 97.9°F | Wt 145.0 lb

## 2014-05-07 DIAGNOSIS — M79609 Pain in unspecified limb: Secondary | ICD-10-CM

## 2014-05-07 DIAGNOSIS — M79601 Pain in right arm: Secondary | ICD-10-CM

## 2014-05-07 DIAGNOSIS — IMO0002 Reserved for concepts with insufficient information to code with codable children: Secondary | ICD-10-CM

## 2014-05-07 DIAGNOSIS — M792 Neuralgia and neuritis, unspecified: Secondary | ICD-10-CM

## 2014-05-07 NOTE — Patient Instructions (Signed)
Go to the lab on the way out.  We'll contact you with your xray report. Take care.

## 2014-05-07 NOTE — Progress Notes (Signed)
Pre visit review using our clinic review tool, if applicable. No additional management support is needed unless otherwise documented below in the visit note.  R arm pain, shooting up the R arm.  No L arm pain.  Started about 2 weeks.  No weakness.  Pain is brief, will self resolve.  No pain with shoulder ROM.  Sensation and grip are wnl.  No trauma.  R handed.  No neck pain.    Meds, vitals, and allergies reviewed.   ROS: See HPI.  Otherwise, noncontributory.  nad ncat Neck supple, no pain on ROM, not ttp rrr ctab Shoulders elbows and wrists with normal ROM B phalen and tinel not ttp No weakness in the BUE, normal reflexes B, normal sensation B

## 2014-05-09 DIAGNOSIS — M792 Neuralgia and neuritis, unspecified: Secondary | ICD-10-CM | POA: Insufficient documentation

## 2014-05-09 NOTE — Assessment & Plan Note (Signed)
Could be from neck source vs carpal tunnel.  No sig abnormality on exam.  Check neck films, negative.  If not better with presumptive bracing of R wrist then he'll notify me.  D/w pt.

## 2014-08-24 ENCOUNTER — Ambulatory Visit (INDEPENDENT_AMBULATORY_CARE_PROVIDER_SITE_OTHER): Payer: BC Managed Care – PPO | Admitting: Family Medicine

## 2014-08-24 ENCOUNTER — Encounter: Payer: BC Managed Care – PPO | Admitting: Family Medicine

## 2014-08-24 ENCOUNTER — Encounter: Payer: Self-pay | Admitting: Family Medicine

## 2014-08-24 VITALS — BP 120/70 | HR 72 | Temp 97.9°F | Ht 69.0 in | Wt 149.0 lb

## 2014-08-24 DIAGNOSIS — Z131 Encounter for screening for diabetes mellitus: Secondary | ICD-10-CM

## 2014-08-24 DIAGNOSIS — Z7189 Other specified counseling: Secondary | ICD-10-CM

## 2014-08-24 DIAGNOSIS — Z23 Encounter for immunization: Secondary | ICD-10-CM

## 2014-08-24 DIAGNOSIS — Z0000 Encounter for general adult medical examination without abnormal findings: Secondary | ICD-10-CM

## 2014-08-24 DIAGNOSIS — Z1211 Encounter for screening for malignant neoplasm of colon: Secondary | ICD-10-CM

## 2014-08-24 DIAGNOSIS — Z125 Encounter for screening for malignant neoplasm of prostate: Secondary | ICD-10-CM

## 2014-08-24 DIAGNOSIS — M792 Neuralgia and neuritis, unspecified: Secondary | ICD-10-CM

## 2014-08-24 DIAGNOSIS — Z13220 Encounter for screening for lipoid disorders: Secondary | ICD-10-CM

## 2014-08-24 NOTE — Patient Instructions (Addendum)
Greg Espinoza will call about your referral for the colonoscopy.  Come back for fasting labs Monday morning.   Glad to see you.  Take care.

## 2014-08-24 NOTE — Progress Notes (Signed)
Pre visit review using our clinic review tool, if applicable. No additional management support is needed unless otherwise documented below in the visit note.  CPE- See plan.  Routine anticipatory guidance given to patient.  See health maintenance. Tetanus 2009 Flu shot prev, done today.  PNA and shingles not due.  Possible/unknown CA hx with his father.  Discussed PSA and colon CA screening.  We talked about false positives with PSAs.  Will return for fasting labs.   Diet and exercise d/w pt, doing well on both.   Living will d/w pt.  Wife designated if patient were incapacitated.    R arm pain resolved with prn bracing. He'll notify me if his sx are worsening.    PMH and SH reviewed  Meds, vitals, and allergies reviewed.   ROS: See HPI.  Otherwise negative.    GEN: nad, alert and oriented HEENT: mucous membranes moist NECK: supple w/o LA CV: rrr. PULM: ctab, no inc wob ABD: soft, +bs EXT: no edema SKIN: no acute rash

## 2014-08-25 DIAGNOSIS — Z7189 Other specified counseling: Secondary | ICD-10-CM | POA: Insufficient documentation

## 2014-08-25 DIAGNOSIS — Z0000 Encounter for general adult medical examination without abnormal findings: Secondary | ICD-10-CM | POA: Insufficient documentation

## 2014-08-25 NOTE — Assessment & Plan Note (Signed)
Resolved with prn bracing, suggesting dx of CTS.  Will continue prn bracing, normal strength and sensation in hands today on exam.

## 2014-08-25 NOTE — Assessment & Plan Note (Signed)
Routine anticipatory guidance given to patient.  See health maintenance. Tetanus 2009 Flu shot prev, done today.  PNA and shingles not due.  Possible/unknown CA hx with his father.  Discussed PSA and colon CA screening.  We talked about false positives with PSAs.  Will return for fasting labs.   Diet and exercise d/w pt, doing well on both.   Living will d/w pt.  Wife designated if patient were incapacitated.

## 2014-08-30 ENCOUNTER — Other Ambulatory Visit (INDEPENDENT_AMBULATORY_CARE_PROVIDER_SITE_OTHER): Payer: BC Managed Care – PPO

## 2014-08-30 DIAGNOSIS — Z13220 Encounter for screening for lipoid disorders: Secondary | ICD-10-CM

## 2014-08-30 DIAGNOSIS — Z125 Encounter for screening for malignant neoplasm of prostate: Secondary | ICD-10-CM

## 2014-08-30 DIAGNOSIS — Z131 Encounter for screening for diabetes mellitus: Secondary | ICD-10-CM

## 2014-08-30 LAB — LIPID PANEL
CHOLESTEROL: 194 mg/dL (ref 0–200)
HDL: 44.6 mg/dL (ref >39.00)
LDL Cholesterol: 137 mg/dL — ABNORMAL HIGH (ref 0–99)
NONHDL: 149.4
Total CHOL/HDL Ratio: 4
Triglycerides: 60 mg/dL (ref 0.0–149.0)
VLDL: 12 mg/dL (ref 0.0–40.0)

## 2014-08-30 LAB — PSA: PSA: 0.51 ng/mL (ref 0.10–4.00)

## 2014-08-30 LAB — GLUCOSE, RANDOM: GLUCOSE: 71 mg/dL (ref 70–99)

## 2014-08-31 ENCOUNTER — Encounter: Payer: Self-pay | Admitting: *Deleted

## 2014-10-28 ENCOUNTER — Ambulatory Visit (AMBULATORY_SURGERY_CENTER): Payer: Self-pay

## 2014-10-28 VITALS — Ht 69.5 in | Wt 150.0 lb

## 2014-10-28 DIAGNOSIS — Z1211 Encounter for screening for malignant neoplasm of colon: Secondary | ICD-10-CM

## 2014-10-28 NOTE — Progress Notes (Signed)
No allergies to eggs or soy No home oxygen No past problems with anesthesia No diet/weight loss meds  Has email.  Emmi instructions given for colonoscopy. 

## 2014-11-11 ENCOUNTER — Encounter: Payer: Self-pay | Admitting: Internal Medicine

## 2014-11-11 ENCOUNTER — Ambulatory Visit (AMBULATORY_SURGERY_CENTER): Payer: BLUE CROSS/BLUE SHIELD | Admitting: Internal Medicine

## 2014-11-11 VITALS — BP 93/69 | HR 53 | Temp 97.1°F | Resp 16 | Ht 69.5 in | Wt 150.0 lb

## 2014-11-11 DIAGNOSIS — D122 Benign neoplasm of ascending colon: Secondary | ICD-10-CM

## 2014-11-11 DIAGNOSIS — D123 Benign neoplasm of transverse colon: Secondary | ICD-10-CM

## 2014-11-11 DIAGNOSIS — Z1211 Encounter for screening for malignant neoplasm of colon: Secondary | ICD-10-CM

## 2014-11-11 MED ORDER — SODIUM CHLORIDE 0.9 % IV SOLN: 500.0000 mL | INTRAVENOUS | Status: DC

## 2014-11-11 NOTE — Progress Notes (Signed)
A/ox3 pleased with MAC, report to Karen RN 

## 2014-11-11 NOTE — Progress Notes (Signed)
Called to room to assist during endoscopic procedure.  Patient ID and intended procedure confirmed with present staff. Received instructions for my participation in the procedure from the performing physician.  

## 2014-11-11 NOTE — Op Note (Signed)
[place]  [address]. [place], [address]   COLONOSCOPY PROCEDURE REPORT  PATIENT: Greg Espinoza, Greg Espinoza  MR#: [id number] BIRTHDATE: April 12, 1966 , 48  yrs. old GENDER: male ENDOSCOPIST: Gatha Mayer, MD, [facility] PROCEDURE DATE:  11/11/2014 PROCEDURE:   Colonoscopy with snare polypectomy First Screening Colonoscopy - Avg.  risk and is 50 yrs.  old or older Yes.  Prior Negative Screening - Now for repeat screening. N/A  History of Adenoma - Now for follow-up colonoscopy & has been > or = to 3 yrs.  N/A  Polyps Removed Today? Yes. ASA CLASS:   Class I INDICATIONS:average risk for colorectal cancer and first colonoscopy. MEDICATIONS: Propofol 200 mg IV and Monitored anesthesia care  DESCRIPTION OF PROCEDURE:   After the risks benefits and alternatives of the procedure were thoroughly explained, informed consent was obtained.  The digital rectal exam revealed no abnormalities of the rectum, revealed the prostate was not enlarged, and revealed no prostatic nodules.   The LB [id number]  endoscope was introduced through the anus and advanced to the cecum, which was identified by both the appendix and ileocecal valve. No adverse events experienced.   The quality of the prep was excellent, using MiraLax  The instrument was then slowly withdrawn as the colon was fully examined.      COLON FINDINGS: Two sessile polyps ranging from 3 to 63mm in size were found in the transverse colon and ascending colon. Polypectomies were performed with a cold snare.  The resection was complete, the polyp tissue was completely retrieved and sent to histology.   The examination was otherwise normal.  Retroflexed views revealed no abnormalities. The time to cecum=2 minutes 38 seconds.  Withdrawal time=13 minutes 13 seconds.  The scope was withdrawn and the procedure completed. COMPLICATIONS: There were no immediate complications.  ENDOSCOPIC IMPRESSION: 1.   Two sessile polyps  ranging from 3 to 59mm in size were found in the transverse colon and ascending colon; polypectomies were performed with a cold snare 2.   The examination was otherwise normal - excellent prep - first screening  RECOMMENDATIONS: Timing of repeat colonoscopy will be determined by pathology findings.  eSigned:  Gatha Mayer, MD, [facility] 11/11/2014 8:37 AM   cc: Elsie Stain, MD and The Patient

## 2014-11-11 NOTE — Patient Instructions (Addendum)
I found and removed two polyps that look benign.  Otherwise ok.  I will let you know pathology results and when to have another routine colonoscopy by mail.  I appreciate the opportunity to care for you. Gatha Mayer, MD, FACG   YOU HAD AN ENDOSCOPIC PROCEDURE TODAY AT [facility]: Refer to the procedure report that was given to you for any specific questions about what was found during the examination.  If the procedure report does not answer your questions, please call your gastroenterologist to clarify.  If you requested that your care partner not be given the details of your procedure findings, then the procedure report has been included in a sealed envelope for you to review at your convenience later.  YOU SHOULD EXPECT: Some feelings of bloating in the abdomen. Passage of more gas than usual.  Walking can help get rid of the air that was put into your GI tract during the procedure and reduce the bloating. If you had a lower endoscopy (such as a colonoscopy or flexible sigmoidoscopy) you may notice spotting of blood in your stool or on the toilet paper. If you underwent a bowel prep for your procedure, then you may not have a normal bowel movement for a few days.  DIET: Your first meal following the procedure should be a light meal and then it is ok to progress to your normal diet.  A half-sandwich or bowl of soup is an example of a good first meal.  Heavy or fried foods are harder to digest and may make you feel nauseous or bloated.  Likewise meals heavy in dairy and vegetables can cause extra gas to form and this can also increase the bloating.  Drink plenty of fluids but you should avoid alcoholic beverages for 24 hours.  ACTIVITY: Your care partner should take you home directly after the procedure.  You should plan to take it easy, moving slowly for the rest of the day.  You can resume normal activity the day after the procedure however you should NOT DRIVE or  use heavy machinery for 24 hours (because of the sedation medicines used during the test).    SYMPTOMS TO REPORT IMMEDIATELY: A gastroenterologist can be reached at any hour.  During normal business hours, 8:30 AM to 5:00 PM Monday through Friday, call [phone].  After hours and on weekends, please call the GI answering service at [phone] who will take a message and have the physician on call contact you.   Following lower endoscopy (colonoscopy or flexible sigmoidoscopy):  Excessive amounts of blood in the stool  Significant tenderness or worsening of abdominal pains  Swelling of the abdomen that is new, acute  Fever of 100F or higher FOLLOW UP: If any biopsies were taken you will be contacted by phone or by letter within the next 1-3 weeks.  Call your gastroenterologist if you have not heard about the biopsies in 3 weeks.  Our staff will call the home number listed on your records the next business day following your procedure to check on you and address any questions or concerns that you may have at that time regarding the information given to you following your procedure. This is a courtesy call and so if there is no answer at the home number and we have not heard from you through the emergency physician on call, we will assume that you have returned to your regular daily activities without incident.  SIGNATURES/CONFIDENTIALITY: You and/or your  care partner have signed paperwork which will be entered into your electronic medical record.  These signatures attest to the fact that that the information above on your After Visit Summary has been reviewed and is understood.  Full responsibility of the confidentiality of this discharge information lies with you and/or your care-partner.

## 2014-11-12 ENCOUNTER — Telehealth: Payer: Self-pay | Admitting: *Deleted

## 2014-11-12 NOTE — Telephone Encounter (Signed)
Left message that we called for f/u 

## 2014-11-18 ENCOUNTER — Encounter: Payer: Self-pay | Admitting: Internal Medicine

## 2014-11-18 DIAGNOSIS — Z860101 Personal history of adenomatous and serrated colon polyps: Secondary | ICD-10-CM

## 2014-11-18 DIAGNOSIS — Z86010 Personal history of colonic polyps: Secondary | ICD-10-CM

## 2014-11-18 HISTORY — DX: Personal history of colonic polyps: Z86.010

## 2014-11-18 HISTORY — DX: Personal history of adenomatous and serrated colon polyps: Z86.0101

## 2014-11-18 NOTE — Progress Notes (Signed)
Quick Note:  2 diminutive adenomas - repeat colon 2021 ______

## 2015-09-15 ENCOUNTER — Other Ambulatory Visit: Payer: Self-pay | Admitting: Family Medicine

## 2015-09-15 DIAGNOSIS — E785 Hyperlipidemia, unspecified: Secondary | ICD-10-CM

## 2015-09-21 ENCOUNTER — Other Ambulatory Visit (INDEPENDENT_AMBULATORY_CARE_PROVIDER_SITE_OTHER): Payer: BLUE CROSS/BLUE SHIELD

## 2015-09-21 DIAGNOSIS — Z0000 Encounter for general adult medical examination without abnormal findings: Secondary | ICD-10-CM | POA: Diagnosis not present

## 2015-09-21 DIAGNOSIS — E785 Hyperlipidemia, unspecified: Secondary | ICD-10-CM | POA: Diagnosis not present

## 2015-09-21 DIAGNOSIS — Z23 Encounter for immunization: Secondary | ICD-10-CM | POA: Diagnosis not present

## 2015-09-21 LAB — LIPID PANEL
CHOLESTEROL: 186 mg/dL (ref 0–200)
HDL: 51.8 mg/dL (ref >39.00)
LDL Cholesterol: 124 mg/dL — ABNORMAL HIGH (ref 0–99)
NONHDL: 133.96
Total CHOL/HDL Ratio: 4
Triglycerides: 50 mg/dL (ref 0.0–149.0)
VLDL: 10 mg/dL (ref 0.0–40.0)

## 2015-09-21 LAB — BASIC METABOLIC PANEL
BUN: 13 mg/dL (ref 6–23)
CALCIUM: 9.4 mg/dL (ref 8.4–10.5)
CO2: 30 mEq/L (ref 19–32)
Chloride: 103 mEq/L (ref 96–112)
Creatinine, Ser: 1.07 mg/dL (ref 0.40–1.50)
GFR: 93.65 mL/min (ref >60.00)
GLUCOSE: 78 mg/dL (ref 70–99)
POTASSIUM: 3.5 meq/L (ref 3.5–5.1)
SODIUM: 141 meq/L (ref 135–145)

## 2015-09-27 ENCOUNTER — Other Ambulatory Visit: Payer: Self-pay | Admitting: Family Medicine

## 2015-09-27 ENCOUNTER — Ambulatory Visit (INDEPENDENT_AMBULATORY_CARE_PROVIDER_SITE_OTHER): Payer: BLUE CROSS/BLUE SHIELD | Admitting: Family Medicine

## 2015-09-27 ENCOUNTER — Encounter: Payer: Self-pay | Admitting: Family Medicine

## 2015-09-27 VITALS — BP 102/70 | HR 63 | Temp 98.3°F | Ht 70.0 in | Wt 149.0 lb

## 2015-09-27 DIAGNOSIS — Z0000 Encounter for general adult medical examination without abnormal findings: Secondary | ICD-10-CM

## 2015-09-27 DIAGNOSIS — Z119 Encounter for screening for infectious and parasitic diseases, unspecified: Secondary | ICD-10-CM

## 2015-09-27 NOTE — Patient Instructions (Signed)
Go to the lab on the way out.  We'll contact you with your lab report. Take care.  Glad to see you.  

## 2015-09-27 NOTE — Progress Notes (Signed)
Pre visit review using our clinic review tool, if applicable. No additional management support is needed unless otherwise documented below in the visit note.  CPE- See plan.  Routine anticipatory guidance given to patient.  See health maintenance. Tetanus 2009 Flu shot2016 PNA and shingles not due.  Possible/unknown CA hx with his father. Discussed PSA. We talked about false positives with PSAs. Consider PSA testing next year.   Colonoscopy 2016 Pt opts in for HCV and HIV screening.  D/w pt re: routine screening.  Diet and exercise d/w pt, doing well on exercise.  Encouraged better diet.   Living will d/w pt. Wife designated if patient were incapacitated.   PMH and SH reviewed  Meds, vitals, and allergies reviewed.   ROS: See HPI.  Otherwise negative.    GEN: nad, alert and oriented HEENT: mucous membranes moist NECK: supple w/o LA CV: rrr. PULM: ctab, no inc wob ABD: soft, +bs EXT: no edema SKIN: no acute rash

## 2015-09-28 LAB — HIV ANTIBODY (ROUTINE TESTING W REFLEX): HIV: NONREACTIVE

## 2015-09-28 LAB — HEPATITIS C ANTIBODY: HCV Ab: NEGATIVE

## 2015-09-28 NOTE — Addendum Note (Signed)
Addended by: Marchia Bond on: 09/28/2015 02:52 PM   Modules accepted: Miquel Dunn

## 2015-09-28 NOTE — Assessment & Plan Note (Signed)
Tetanus 2009  Flu shot 2016  PNA and shingles not due.  Possible/unknown CA hx with his father. Discussed PSA. We talked about false positives with PSAs. Consider PSA testing next year.  Colonoscopy 2016  Pt opts in for HCV and HIV screening. D/w pt re: routine screening.  Diet and exercise d/w pt, doing well on exercise. Encouraged better diet.  Living will d/w pt. Wife designated if patient were incapacitated.  Labs d/w pt.

## 2016-09-25 ENCOUNTER — Encounter: Payer: BLUE CROSS/BLUE SHIELD | Admitting: Family Medicine

## 2016-10-02 ENCOUNTER — Other Ambulatory Visit: Payer: Self-pay | Admitting: Family Medicine

## 2016-10-02 DIAGNOSIS — Z125 Encounter for screening for malignant neoplasm of prostate: Secondary | ICD-10-CM

## 2016-10-02 DIAGNOSIS — E7800 Pure hypercholesterolemia, unspecified: Secondary | ICD-10-CM

## 2016-10-03 ENCOUNTER — Other Ambulatory Visit (INDEPENDENT_AMBULATORY_CARE_PROVIDER_SITE_OTHER): Payer: BLUE CROSS/BLUE SHIELD

## 2016-10-03 DIAGNOSIS — E7800 Pure hypercholesterolemia, unspecified: Secondary | ICD-10-CM

## 2016-10-03 DIAGNOSIS — Z125 Encounter for screening for malignant neoplasm of prostate: Secondary | ICD-10-CM

## 2016-10-03 LAB — LIPID PANEL
Cholesterol: 172 mg/dL (ref 0–200)
HDL: 49.2 mg/dL (ref >39.00)
LDL Cholesterol: 110 mg/dL — ABNORMAL HIGH (ref 0–99)
NonHDL: 122.47
TRIGLYCERIDES: 63 mg/dL (ref 0.0–149.0)
Total CHOL/HDL Ratio: 3
VLDL: 12.6 mg/dL (ref 0.0–40.0)

## 2016-10-03 LAB — BASIC METABOLIC PANEL
BUN: 13 mg/dL (ref 6–23)
CO2: 30 mEq/L (ref 19–32)
Calcium: 9 mg/dL (ref 8.4–10.5)
Chloride: 105 mEq/L (ref 96–112)
Creatinine, Ser: 0.94 mg/dL (ref 0.40–1.50)
GFR: 108.31 mL/min (ref >60.00)
Glucose, Bld: 88 mg/dL (ref 70–99)
POTASSIUM: 3.8 meq/L (ref 3.5–5.1)
SODIUM: 142 meq/L (ref 135–145)

## 2016-10-03 LAB — PSA: PSA: 0.37 ng/mL (ref 0.10–4.00)

## 2016-10-05 ENCOUNTER — Ambulatory Visit (INDEPENDENT_AMBULATORY_CARE_PROVIDER_SITE_OTHER): Payer: BLUE CROSS/BLUE SHIELD | Admitting: Family Medicine

## 2016-10-05 ENCOUNTER — Encounter: Payer: Self-pay | Admitting: Family Medicine

## 2016-10-05 VITALS — BP 122/72 | HR 61 | Temp 97.7°F | Ht 70.0 in | Wt 149.8 lb

## 2016-10-05 DIAGNOSIS — Z0000 Encounter for general adult medical examination without abnormal findings: Secondary | ICD-10-CM | POA: Diagnosis not present

## 2016-10-05 DIAGNOSIS — Z23 Encounter for immunization: Secondary | ICD-10-CM | POA: Diagnosis not present

## 2016-10-05 NOTE — Progress Notes (Signed)
Pre visit review using our clinic review tool, if applicable. No additional management support is needed unless otherwise documented below in the visit note. 

## 2016-10-05 NOTE — Progress Notes (Signed)
CPE- See plan.  Routine anticipatory guidance given to patient.  See health maintenance. Tetanus 2009 Flu [id number] PNA and shingles not due.  Possible/unknown CA hx with his father. Discussed PSA. PSA wnl.  Colonoscopy 2016 HCV and HIV screening prev done.  Diet and exercise d/w pt, doing well on exercise.  Encouraged better diet.  D/w pt.   Living will d/w pt. Wife designated if patient were incapacitated.   PMH and SH reviewed  Meds, vitals, and allergies reviewed.   ROS: Per HPI.  Unless specifically indicated otherwise in HPI, the patient denies:  General: fever. Eyes: acute vision changes ENT: sore throat Cardiovascular: chest pain Respiratory: SOB GI: vomiting GU: dysuria Musculoskeletal: acute back pain Derm: acute rash Neuro: acute motor dysfunction Psych: worsening mood Endocrine: polydipsia Heme: bleeding Allergy: hayfever  GEN: nad, alert and oriented HEENT: mucous membranes moist NECK: supple w/o LA CV: rrr. PULM: ctab, no inc wob ABD: soft, +bs EXT: no edema SKIN: no acute rash

## 2016-10-05 NOTE — Patient Instructions (Signed)
Take care.  Glad to see you.  Update me as needed.  Happy New Year.

## 2016-10-08 NOTE — Assessment & Plan Note (Addendum)
Tetanus 2009 Flu [id number] PNA and shingles not due.  Possible/unknown CA hx with his father. Discussed PSA. PSA wnl.  Colonoscopy 2016 HCV and HIV screening prev done.  Diet and exercise d/w pt, doing well on exercise.  Encouraged better diet.  D/w pt.   Living will d/w pt. Wife designated if patient were incapacitated.  Labs discussed with patient. Lipids improved from previous.

## 2017-09-30 ENCOUNTER — Other Ambulatory Visit: Payer: Self-pay | Admitting: Family Medicine

## 2017-09-30 DIAGNOSIS — Z125 Encounter for screening for malignant neoplasm of prostate: Secondary | ICD-10-CM

## 2017-09-30 DIAGNOSIS — E7800 Pure hypercholesterolemia, unspecified: Secondary | ICD-10-CM

## 2017-10-03 ENCOUNTER — Other Ambulatory Visit (INDEPENDENT_AMBULATORY_CARE_PROVIDER_SITE_OTHER): Payer: BLUE CROSS/BLUE SHIELD

## 2017-10-03 DIAGNOSIS — Z125 Encounter for screening for malignant neoplasm of prostate: Secondary | ICD-10-CM

## 2017-10-03 DIAGNOSIS — E7800 Pure hypercholesterolemia, unspecified: Secondary | ICD-10-CM | POA: Diagnosis not present

## 2017-10-03 LAB — LIPID PANEL
CHOLESTEROL: 154 mg/dL (ref 0–200)
HDL: 48 mg/dL (ref >39.00)
LDL Cholesterol: 95 mg/dL (ref 0–99)
NONHDL: 106.29
Total CHOL/HDL Ratio: 3
Triglycerides: 57 mg/dL (ref 0.0–149.0)
VLDL: 11.4 mg/dL (ref 0.0–40.0)

## 2017-10-03 LAB — BASIC METABOLIC PANEL
BUN: 15 mg/dL (ref 6–23)
CALCIUM: 9.4 mg/dL (ref 8.4–10.5)
CO2: 30 mEq/L (ref 19–32)
Chloride: 103 mEq/L (ref 96–112)
Creatinine, Ser: 1.09 mg/dL (ref 0.40–1.50)
GFR: 90.94 mL/min (ref >60.00)
GLUCOSE: 80 mg/dL (ref 70–99)
POTASSIUM: 3.6 meq/L (ref 3.5–5.1)
SODIUM: 139 meq/L (ref 135–145)

## 2017-10-03 LAB — PSA: PSA: 0.43 ng/mL (ref 0.10–4.00)

## 2017-10-10 ENCOUNTER — Ambulatory Visit (INDEPENDENT_AMBULATORY_CARE_PROVIDER_SITE_OTHER): Payer: BLUE CROSS/BLUE SHIELD | Admitting: Family Medicine

## 2017-10-10 ENCOUNTER — Encounter: Payer: Self-pay | Admitting: Family Medicine

## 2017-10-10 VITALS — BP 116/74 | HR 61 | Temp 97.8°F | Ht 70.0 in | Wt 147.0 lb

## 2017-10-10 DIAGNOSIS — Z23 Encounter for immunization: Secondary | ICD-10-CM | POA: Diagnosis not present

## 2017-10-10 DIAGNOSIS — Z7189 Other specified counseling: Secondary | ICD-10-CM

## 2017-10-10 DIAGNOSIS — Z0000 Encounter for general adult medical examination without abnormal findings: Secondary | ICD-10-CM

## 2017-10-10 NOTE — Patient Instructions (Addendum)
Get a toe separator at the pharmacy.  If that doesn't help then let me know.  Flu and tetanus shots today.  Your labs are fine.  Update me as needed.  Take care.  Glad to see you.

## 2017-10-10 NOTE — Progress Notes (Signed)
CPE- See plan.  Routine anticipatory guidance given to patient.  See health maintenance.  The possibility exists that previously documented standard health maintenance information may have been brought forward from a previous encounter into this note.  If needed, that same information has been updated to reflect the current situation based on today's encounter.  Tetanus 2019 Flu [id number] PNA and shingles not due.  Possible/unknown CA hx with his father. Discussed PSA. PSA wnl.  Colonoscopy 2016 HCV and HIV screening prev done.  Diet and exercise d/w pt, doing well on exercise. Encouraged better diet, again d/w pt.   Living will d/w pt. Wife designated if patient were incapacitated.  Labs discussed with patient. Lipids improved from previous, d/w pt.    Irritation between 1st and 2nd toes on R foot, medial 2nd toe.  Going on for about 2-3 weeks.  No trauma.  He walks a lot at work, but doesn't have to wear steel toe boots.    PMH and SH reviewed  Meds, vitals, and allergies reviewed.   ROS: Per HPI.  Unless specifically indicated otherwise in HPI, the patient denies:  General: fever. Eyes: acute vision changes ENT: sore throat Cardiovascular: chest pain Respiratory: SOB GI: vomiting GU: dysuria Musculoskeletal: acute back pain Derm: acute rash Neuro: acute motor dysfunction Psych: worsening mood Endocrine: polydipsia Heme: bleeding Allergy: hayfever  GEN: nad, alert and oriented HEENT: mucous membranes moist NECK: supple w/o LA CV: rrr. PULM: ctab, no inc wob ABD: soft, +bs EXT: no edema SKIN: no acute rash He has partial overlap/contact with the the R 1st toe rubbing and causing a callus on the medial 2nd toe w/o ulceration.

## 2017-10-10 NOTE — Assessment & Plan Note (Signed)
Living will d/w pt.  Wife designated if patient were incapacitated.   ?

## 2017-10-10 NOTE — Assessment & Plan Note (Signed)
Tetanus 2019 Flu [id number] PNA and shingles not due.  Possible/unknown CA hx with his father. Discussed PSA. PSA wnl.  Colonoscopy 2016 HCV and HIV screening prev done.  Diet and exercise d/w pt, doing well on exercise. Encouraged better diet, again d/w pt.    Living will d/w pt. Wife designated if patient were incapacitated.  Labs discussed with patient. Lipids improved from previous, d/w pt.    Okay to use a toe separator and update me as needed.  He agrees.

## 2017-12-19 ENCOUNTER — Ambulatory Visit (INDEPENDENT_AMBULATORY_CARE_PROVIDER_SITE_OTHER): Payer: BLUE CROSS/BLUE SHIELD | Admitting: Podiatry

## 2017-12-19 ENCOUNTER — Encounter: Payer: Self-pay | Admitting: Podiatry

## 2017-12-19 VITALS — BP 123/78 | HR 52

## 2017-12-19 DIAGNOSIS — M2041 Other hammer toe(s) (acquired), right foot: Secondary | ICD-10-CM

## 2017-12-19 DIAGNOSIS — M2010 Hallux valgus (acquired), unspecified foot: Secondary | ICD-10-CM

## 2017-12-19 DIAGNOSIS — L84 Corns and callosities: Secondary | ICD-10-CM

## 2017-12-19 NOTE — Progress Notes (Signed)
   Subjective:    Patient ID: Greg Quaker., male    DOB: 15-Feb-1966, 52 y.o.   MRN: [id number]  HPIthis patient presents the office with chief complaint of a painful corn between his first and second toes, right foot.  He presents the office today with his wife for an evaluation.  He says this corn has been painful for the last 2-3 months.  He has provided no self treatment nor sought any professional help.  He denies any history of trauma or injury to the toes.  This patient  presents the office today for an evaluation and treatment of this painful corn.      Review of Systems  All other systems reviewed and are negative.      Objective:   Physical Exam General Appearance  Alert, conversant and in no acute stress.  Vascular  Dorsalis pedis and posterior tibial  pulses are palpable  bilaterally.  Capillary return is within normal limits  bilaterally. Temperature is within normal limits  bilaterally.  Neurologic  Senn-Weinstein monofilament wire test within normal limits  bilaterally. Muscle power within normal limits bilaterally.  Nails Thick disfigured discolored nails with subungual debris  from hallux to fifth toes bilaterally. No evidence of bacterial infection or drainage bilaterally.  Orthopedic  No limitations of motion of motion feet .  No crepitus or effusions noted.  HAV 1st MPJ  B/L.  Hammer toes  B/L  Skin  normotropic skin with no porokeratosis noted bilaterally.  No signs of infections or ulcers noted. Corn noted on lateral aspect hallux right and medial aspect second toe right foot.  No drainage noted.          Assessment & Plan:  Corn  HAV  B/L  Hammer toe  B/L   IE  Debride corn padding.  Explained to this patient. This was caused due to the bunion on the right forefoot and the hammer toe second digit right foot.  I divided the corn and dispensed padding.  I informed this patient that surgery would be the only way to correct the deformity which is causing this  corn. He is to return to the office when necessary   Gardiner Barefoot DPM

## 2019-01-06 ENCOUNTER — Other Ambulatory Visit: Payer: BLUE CROSS/BLUE SHIELD

## 2019-01-15 ENCOUNTER — Encounter: Payer: BLUE CROSS/BLUE SHIELD | Admitting: Family Medicine

## 2019-03-19 ENCOUNTER — Other Ambulatory Visit (INDEPENDENT_AMBULATORY_CARE_PROVIDER_SITE_OTHER): Payer: BC Managed Care – PPO

## 2019-03-19 ENCOUNTER — Other Ambulatory Visit: Payer: Self-pay | Admitting: Family Medicine

## 2019-03-19 DIAGNOSIS — Z125 Encounter for screening for malignant neoplasm of prostate: Secondary | ICD-10-CM

## 2019-03-19 DIAGNOSIS — E7800 Pure hypercholesterolemia, unspecified: Secondary | ICD-10-CM

## 2019-03-19 LAB — BASIC METABOLIC PANEL
BUN: 19 mg/dL (ref 6–23)
CO2: 30 mEq/L (ref 19–32)
Calcium: 9.6 mg/dL (ref 8.4–10.5)
Chloride: 101 mEq/L (ref 96–112)
Creatinine, Ser: 1.29 mg/dL (ref 0.40–1.50)
GFR: 70.06 mL/min (ref >60.00)
Glucose, Bld: 75 mg/dL (ref 70–99)
Potassium: 4.1 mEq/L (ref 3.5–5.1)
Sodium: 139 mEq/L (ref 135–145)

## 2019-03-19 LAB — LIPID PANEL
Cholesterol: 189 mg/dL (ref 0–200)
HDL: 39.7 mg/dL (ref >39.00)
LDL Cholesterol: 128 mg/dL — ABNORMAL HIGH (ref 0–99)
NonHDL: 149.24
Total CHOL/HDL Ratio: 5
Triglycerides: 106 mg/dL (ref 0.0–149.0)
VLDL: 21.2 mg/dL (ref 0.0–40.0)

## 2019-03-19 LAB — PSA: PSA: 0.5 ng/mL (ref 0.10–4.00)

## 2019-03-26 ENCOUNTER — Encounter: Payer: Self-pay | Admitting: Family Medicine

## 2019-03-26 ENCOUNTER — Ambulatory Visit (INDEPENDENT_AMBULATORY_CARE_PROVIDER_SITE_OTHER): Payer: BC Managed Care – PPO | Admitting: Family Medicine

## 2019-03-26 ENCOUNTER — Other Ambulatory Visit: Payer: Self-pay

## 2019-03-26 DIAGNOSIS — Z0000 Encounter for general adult medical examination without abnormal findings: Secondary | ICD-10-CM

## 2019-03-26 DIAGNOSIS — Z7189 Other specified counseling: Secondary | ICD-10-CM

## 2019-03-26 NOTE — Progress Notes (Signed)
Interactive audio and video telecommunications were attempted between this provider and patient, however failed, due to patient having technical difficulties OR patient did not have access to video capability.  We continued and completed visit with audio only.   Virtual Visit via Telephone Note  I connected with patient on 03/26/19  at 12:53 PM  by telephone and verified that I am speaking with the correct person using two identifiers.  Location of patient: home.    Location of MD: [facility] Name of referring provider (if blank then none associated): Names per persons and role in encounter:  MD: Earlyne Iba, Patient: name listed above.    I discussed the limitations, risks, security and privacy concerns of performing an evaluation and management service by telephone and the availability of in person appointments. I also discussed with the patient that there may be a patient responsible charge related to this service. The patient expressed understanding and agreed to proceed.  CC: CPE  History of Present Illness:  Tetanus 2019 Flu shot2019 PNA and shingles not due.  Possible/unknown CA hx with his father. Discussed PSA. PSA wnl.  Colonoscopy 2016 HCV and HIV screening prev done.  Diet and exercise d/w pt, doing well on exercise. Encouraged better diet, d/w pt.  Living will d/w pt. Wife designated if patient were incapacitated.  Labs discussed with patient. Lipids up some from previous, d/w pt.    He'll try to check BP and update me if >140/>90. Temp 96.5.  Weight 142 lbs.   Addendum, update blood pressure 91/62.  Pulse 73.  Observations/Objective: nad Speech wnl.   Assessment and Plan:  Tetanus 2019 Flu [id number] PNA and shingles not due.  Possible/unknown CA hx with his father. Discussed PSA. PSA wnl.  Colonoscopy 2016 HCV and HIV screening prev done.  Diet and exercise d/w pt, doing well on exercise. Encouraged better diet, d/w pt.  Living will  d/w pt. Wife designated if patient were incapacitated.  Labs discussed with patient. Lipids up some from previous, d/w pt.    He'll try to check BP and update me if >140/>90. Temp 96.5.  Weight 142 lbs.   Addendum, update blood pressure 91/62.  Pulse 73.  Follow Up Instructions: see above.    I discussed the assessment and treatment plan with the patient. The patient was provided an opportunity to ask questions and all were answered. The patient agreed with the plan and demonstrated an understanding of the instructions.   The patient was advised to call back or seek an in-person evaluation if the symptoms worsen or if the condition fails to improve as anticipated.  I provided 15 minutes of non-face-to-face time during this encounter.   Elsie Stain, MD

## 2019-03-29 NOTE — Assessment & Plan Note (Signed)
Living will d/w pt.  Wife designated if patient were incapacitated.   ?

## 2019-03-29 NOTE — Assessment & Plan Note (Addendum)
Tetanus 2019 Flu [id number] PNA and shingles not due.  Possible/unknown CA hx with his father. Discussed PSA. PSA wnl.  Colonoscopy 2016 HCV and HIV screening prev done.  Diet and exercise d/w pt, doing well on exercise. Encouraged better diet, d/w pt.  Living will d/w pt. Wife designated if patient were incapacitated.  Labs discussed with patient. Lipids up some from previous, d/w pt and diet/exercise.   He'll try to check BP and update me if >140/>90. Temp 96.5.  Weight 142 lbs.   Addendum, update blood pressure 91/62.  Pulse 73.

## 2021-04-11 ENCOUNTER — Other Ambulatory Visit (INDEPENDENT_AMBULATORY_CARE_PROVIDER_SITE_OTHER): Payer: BC Managed Care – PPO

## 2021-04-11 ENCOUNTER — Other Ambulatory Visit: Payer: Self-pay | Admitting: Family Medicine

## 2021-04-11 ENCOUNTER — Other Ambulatory Visit: Payer: Self-pay

## 2021-04-11 DIAGNOSIS — Z125 Encounter for screening for malignant neoplasm of prostate: Secondary | ICD-10-CM | POA: Diagnosis not present

## 2021-04-11 DIAGNOSIS — E7800 Pure hypercholesterolemia, unspecified: Secondary | ICD-10-CM

## 2021-04-11 LAB — COMPREHENSIVE METABOLIC PANEL
ALT: 15 U/L (ref 0–53)
AST: 20 U/L (ref 0–37)
Albumin: 4.5 g/dL (ref 3.5–5.2)
Alkaline Phosphatase: 91 U/L (ref 39–117)
BUN: 13 mg/dL (ref 6–23)
CO2: 27 mEq/L (ref 19–32)
Calcium: 9.7 mg/dL (ref 8.4–10.5)
Chloride: 104 mEq/L (ref 96–112)
Creatinine, Ser: 1.09 mg/dL (ref 0.40–1.50)
GFR: 75.77 mL/min (ref >60.00)
Glucose, Bld: 78 mg/dL (ref 70–99)
Potassium: 3.8 mEq/L (ref 3.5–5.1)
Sodium: 140 mEq/L (ref 135–145)
Total Bilirubin: 1.5 mg/dL — ABNORMAL HIGH (ref 0.2–1.2)
Total Protein: 7 g/dL (ref 6.0–8.3)

## 2021-04-11 LAB — LIPID PANEL
Cholesterol: 175 mg/dL (ref 0–200)
HDL: 48.2 mg/dL (ref >39.00)
LDL Cholesterol: 115 mg/dL — ABNORMAL HIGH (ref 0–99)
NonHDL: 126.59
Total CHOL/HDL Ratio: 4
Triglycerides: 59 mg/dL (ref 0.0–149.0)
VLDL: 11.8 mg/dL (ref 0.0–40.0)

## 2021-04-11 LAB — PSA: PSA: 0.54 ng/mL (ref 0.10–4.00)

## 2021-04-17 ENCOUNTER — Ambulatory Visit (INDEPENDENT_AMBULATORY_CARE_PROVIDER_SITE_OTHER): Payer: BC Managed Care – PPO | Admitting: Family Medicine

## 2021-04-17 ENCOUNTER — Other Ambulatory Visit: Payer: Self-pay

## 2021-04-17 ENCOUNTER — Encounter: Payer: Self-pay | Admitting: Family Medicine

## 2021-04-17 VITALS — BP 118/62 | HR 52 | Temp 96.7°F | Ht 70.0 in | Wt 148.0 lb

## 2021-04-17 DIAGNOSIS — Z0000 Encounter for general adult medical examination without abnormal findings: Secondary | ICD-10-CM | POA: Diagnosis not present

## 2021-04-17 DIAGNOSIS — Z7189 Other specified counseling: Secondary | ICD-10-CM

## 2021-04-17 NOTE — Progress Notes (Signed)
This visit occurred during the SARS-CoV-2 public health emergency.  Safety protocols were in place, including screening questions prior to the visit, additional usage of staff PPE, and extensive cleaning of exam room while observing appropriate contact time as indicated for disinfecting solutions.  CPE- See plan.  Routine anticipatory guidance given to patient.  See health maintenance.  The possibility exists that previously documented standard health maintenance information may have been brought forward from a previous encounter into this note.  If needed, that same information has been updated to reflect the current situation based on today's encounter.    Tetanus 2019 Flu shot encouraged.   PNA not due.   Shingles d/w pt.  Advised to check on coverage.   Covid vaccine done.   Possible/unknown CA hx with his father.  Discussed PSA.  PSA wnl. Colonoscopy 2016, per GI due 2023 HCV and HIV screening prev done. Diet and exercise d/w pt, doing well on exercise.  Encouraged better diet, d/w pt.   Living will d/w pt.  Wife designated if patient were incapacitated.    Labs discussed with patient.   He isn't lightheaded on standing.    PMH and SH reviewed  Meds, vitals, and allergies reviewed.   ROS: Per HPI.  Unless specifically indicated otherwise in HPI, the patient denies:  General: fever. Eyes: acute vision changes ENT: sore throat Cardiovascular: chest pain Respiratory: SOB GI: vomiting GU: dysuria Musculoskeletal: acute back pain Derm: acute rash Neuro: acute motor dysfunction Psych: worsening mood Endocrine: polydipsia Heme: bleeding Allergy: hayfever  GEN: nad, alert and oriented HEENT ncat NECK: supple w/o LA CV: rrr. PULM: ctab, no inc wob ABD: soft, +bs EXT: no edema SKIN: no acute rash  The 10-year ASCVD risk score Mikey Bussing DC Jr., et al., 2013) is: 5.9%   Values used to calculate the score:     Age: 55 years     Sex: Male     Is Non-Hispanic African American:  Yes     Diabetic: No     Tobacco smoker: No     Systolic Blood Pressure: 503 mmHg     Is BP treated: No     HDL Cholesterol: 48.2 mg/dL     Total Cholesterol: 175 mg/dL  Labs discussed with patient

## 2021-04-17 NOTE — Patient Instructions (Signed)
GI should contact you next year . Check with your insurance to see if they will cover the shingles shot. Take care.  Glad to see you.

## 2021-04-19 NOTE — Assessment & Plan Note (Signed)
Tetanus 2019 Flu shot encouraged.   PNA not due.   Shingles d/w pt.  Advised to check on coverage.   Covid vaccine done.   Possible/unknown CA hx with his father.  Discussed PSA.  PSA wnl. Colonoscopy 2016, per GI due 2023 HCV and HIV screening prev done. Diet and exercise d/w pt, doing well on exercise.  Encouraged better diet, d/w pt.   Living will d/w pt.  Wife designated if patient were incapacitated.    Labs discussed with patient.  Discussed with patient about ASCVD score and would continue work on diet and exercise.  We can recheck periodically.

## 2021-04-19 NOTE — Assessment & Plan Note (Signed)
Living will d/w pt.  Wife designated if patient were incapacitated.   ?

## 2022-02-09 ENCOUNTER — Encounter: Payer: Self-pay | Admitting: Internal Medicine

## 2022-04-16 ENCOUNTER — Other Ambulatory Visit: Payer: Self-pay | Admitting: Family Medicine

## 2022-04-16 DIAGNOSIS — E7800 Pure hypercholesterolemia, unspecified: Secondary | ICD-10-CM

## 2022-04-16 DIAGNOSIS — Z125 Encounter for screening for malignant neoplasm of prostate: Secondary | ICD-10-CM

## 2022-04-17 ENCOUNTER — Other Ambulatory Visit (INDEPENDENT_AMBULATORY_CARE_PROVIDER_SITE_OTHER): Payer: BC Managed Care – PPO

## 2022-04-17 DIAGNOSIS — Z125 Encounter for screening for malignant neoplasm of prostate: Secondary | ICD-10-CM | POA: Diagnosis not present

## 2022-04-17 DIAGNOSIS — E7800 Pure hypercholesterolemia, unspecified: Secondary | ICD-10-CM | POA: Diagnosis not present

## 2022-04-17 LAB — COMPREHENSIVE METABOLIC PANEL
ALT: 17 U/L (ref 0–53)
AST: 18 U/L (ref 0–37)
Albumin: 4.3 g/dL (ref 3.5–5.2)
Alkaline Phosphatase: 80 U/L (ref 39–117)
BUN: 14 mg/dL (ref 6–23)
CO2: 27 mEq/L (ref 19–32)
Calcium: 9.2 mg/dL (ref 8.4–10.5)
Chloride: 104 mEq/L (ref 96–112)
Creatinine, Ser: 0.99 mg/dL (ref 0.40–1.50)
GFR: 84.44 mL/min (ref >60.00)
Glucose, Bld: 80 mg/dL (ref 70–99)
Potassium: 3.7 mEq/L (ref 3.5–5.1)
Sodium: 139 mEq/L (ref 135–145)
Total Bilirubin: 1 mg/dL (ref 0.2–1.2)
Total Protein: 6.5 g/dL (ref 6.0–8.3)

## 2022-04-17 LAB — LIPID PANEL
Cholesterol: 171 mg/dL (ref 0–200)
HDL: 52 mg/dL (ref >39.00)
LDL Cholesterol: 103 mg/dL — ABNORMAL HIGH (ref 0–99)
NonHDL: 118.97
Total CHOL/HDL Ratio: 3
Triglycerides: 80 mg/dL (ref 0.0–149.0)
VLDL: 16 mg/dL (ref 0.0–40.0)

## 2022-04-17 LAB — PSA: PSA: 0.44 ng/mL (ref 0.10–4.00)

## 2022-04-26 ENCOUNTER — Encounter: Payer: Self-pay | Admitting: Family Medicine

## 2022-04-26 ENCOUNTER — Ambulatory Visit (INDEPENDENT_AMBULATORY_CARE_PROVIDER_SITE_OTHER): Payer: BC Managed Care – PPO | Admitting: Family Medicine

## 2022-04-26 VITALS — BP 112/62 | HR 69 | Temp 97.8°F | Ht 70.0 in | Wt 147.0 lb

## 2022-04-26 DIAGNOSIS — Z0000 Encounter for general adult medical examination without abnormal findings: Secondary | ICD-10-CM | POA: Diagnosis not present

## 2022-04-26 DIAGNOSIS — Z7189 Other specified counseling: Secondary | ICD-10-CM

## 2022-04-26 NOTE — Patient Instructions (Signed)
Call about seeing GI.  Take care.  Glad to see you. I would get a flu shot each fall.   Check with your insurance to see if they will cover the shingles shot.

## 2022-04-26 NOTE — Progress Notes (Signed)
CPE- See plan.  Routine anticipatory guidance given to patient.  See health maintenance.  The possibility exists that previously documented standard health maintenance information may have been brought forward from a previous encounter into this note.  If needed, that same information has been updated to reflect the current situation based on today's encounter.    Tetanus 2019 Flu shot encouraged.   PNA not due.   Shingles d/w pt.  Advised to check on coverage.   Covid vaccine done.   Possible/unknown CA hx with his father.  Discussed PSA.  PSA wnl. Colonoscopy 2016, per GI due 2023. Letter given to patient.   HCV and HIV screening prev done. Diet and exercise d/w pt, doing well on exercise.  Encouraged better diet, d/w pt.   Living will d/w pt.  Wife designated if patient were incapacitated.    Labs discussed with patient.  Discussed with patient about ASCVD score and would continue work on diet and exercise.  We can recheck periodically.  The 10-year ASCVD risk score (Arnett DK, et al., 2019) is: 5.4%   Values used to calculate the score:     Age: 56 years     Sex: Male     Is Non-Hispanic African American: Yes     Diabetic: No     Tobacco smoker: No     Systolic Blood Pressure: 160 mmHg     Is BP treated: No     HDL Cholesterol: 52 mg/dL     Total Cholesterol: 171 mg/dL  PMH and SH reviewed  Meds, vitals, and allergies reviewed.   ROS: Per HPI.  Unless specifically indicated otherwise in HPI, the patient denies:  General: fever. Eyes: acute vision changes ENT: sore throat Cardiovascular: chest pain Respiratory: SOB GI: vomiting GU: dysuria Musculoskeletal: acute back pain Derm: acute rash Neuro: acute motor dysfunction Psych: worsening mood Endocrine: polydipsia Heme: bleeding Allergy: hayfever  GEN: nad, alert and oriented HEENT: ncat NECK: supple w/o LA CV: rrr. PULM: ctab, no inc wob ABD: soft, +bs EXT: no edema SKIN: no acute rash

## 2022-04-29 NOTE — Assessment & Plan Note (Signed)
Living will d/w pt.  Wife designated if patient were incapacitated.   ?

## 2022-04-29 NOTE — Assessment & Plan Note (Signed)
Tetanus 2019 Flu shot encouraged.   PNA not due.   Shingles d/w pt.  Advised to check on coverage.   Covid vaccine done.   Possible/unknown CA hx with his father.  Discussed PSA.  PSA wnl. Colonoscopy 2016, per GI due 2023. Letter given to patient.   HCV and HIV screening prev done. Diet and exercise d/w pt, doing well on exercise.  Encouraged better diet, d/w pt.   Living will d/w pt.  Wife designated if patient were incapacitated.    Labs discussed with patient.  Discussed with patient about ASCVD score and would continue work on diet and exercise.  We can recheck periodically.

## 2022-10-23 NOTE — Progress Notes (Signed)
Greg Espinoza T. Greg Shrader, MD, [place] [facility] [place], [address]  Phone: [phone]  FAX: [place]. - 57 y.o. male  MRN [id number]  Date of Birth: 1966-10-07  Date: 10/24/2022  PCP: Greg Ghent, MD  Referral: Greg Ghent, MD  Chief Complaint  Patient presents with   Knee Pain    Right   Fall    2 weeks ago   Subjective:   Greg Espinoza. is a 57 y.o. very pleasant male patient with Body mass index is 22.13 kg/m. who presents with the following:  Pleasant patient presents with acute knee pain after injury.  Fell on his R knee and it has been swollen ever since.  Stepped over some wood, and a piece of wood hit his knee.   He tripped, lost his balance, and then he struck the anterior aspect of his right knee.  He has a large, boggy effusion just adjacent to the patella.  He is not really having any significant joint line pain or mechanical buckling of the knee.  No other mechanical symptoms, but he does have some discomfort when he tries to fully flex the knee.  Aspirate 33 mL  Prepatellar bursitis  Review of Systems is noted in the HPI, as appropriate  Objective:   BP 100/60   Pulse (!) 56   Temp 98 F (36.7 C) (Oral)   Ht '5\' 10"'$  (1.778 m)   Wt 154 lb 4 oz (70 kg)   SpO2 98%   BMI 22.13 kg/m   GEN: No acute distress; alert,appropriate. PULM: Breathing comfortably in no respiratory distress PSYCH: Normally interactive.   Right knee: Full extension, flexion to 125.  Stable to varus and valgus stress.  ACL and PCL are intact. No pain on the joint line.  He does have some tenderness to palpation at the patella. Large boggy prepatellar bursitis.  Laboratory and Imaging Data:  Assessment and Plan:     ICD-10-CM   1. Prepatellar bursitis, right knee  M70.41     2. Acute pain of right knee  M25.561 DG Knee Complete 4 Views Right    3. Need for influenza  vaccination  Z23 Flu Vaccine QUAD 6+ mos PF IM (Fluarix Quad PF)     Patient presents with an acute prepatellar bursitis, traumatic.  We discussed what this means and prepatellar bursa in general.  I am going to aspirate his knee and placing compression on it today.  Aspiration/Injection Procedure Note Greg Mauch Kindley Jr. [birth year] Date of procedure: 10/24/2022  Procedure: Large Joint Aspiration / Injection with synovial fluid aspiration of knee, prepatellar bursa, right Indications: Pain  Procedure Details Patient verbally consented; risks, benefits, and alternatives explained. Patient prepped with Chloraprep. Ethyl chloride for anesthesia.  Under sterilne conditions, 18 gauge needle used via lateral approach to aspirate 30 cc of serosanguineous fluid.  No additional medication is injected.  tolerated well, decreased pain, no complications. Medication: 1 mL of Kenalog 40 mg   A compression wrap with an Ace bandage was applied, and he has been to keep this on for the next 3 to 5 days.  Orders placed today for conditions managed today: Orders Placed This Encounter  Procedures   DG Knee Complete 4 Views Right   Flu Vaccine QUAD 6+ mos PF IM (Fluarix Quad PF)    Disposition: No follow-ups on file.  [employer] speech-to-text software was used  for transcription in this dictation.  Possible transcriptional errors can occur using Editor, commissioning.   Signed,  Greg Deed. Judson Tsan, MD   No outpatient encounter medications on file as of 10/24/2022.   No facility-administered encounter medications on file as of 10/24/2022.

## 2022-10-24 ENCOUNTER — Ambulatory Visit: Payer: BC Managed Care – PPO | Admitting: Family Medicine

## 2022-10-24 ENCOUNTER — Ambulatory Visit (INDEPENDENT_AMBULATORY_CARE_PROVIDER_SITE_OTHER)
Admission: RE | Admit: 2022-10-24 | Discharge: 2022-10-24 | Disposition: A | Discharge status: Home / Self Care | Payer: BC Managed Care – PPO | Source: Ambulatory Visit | Attending: Family Medicine | Admitting: Family Medicine

## 2022-10-24 ENCOUNTER — Encounter: Payer: Self-pay | Admitting: Family Medicine

## 2022-10-24 VITALS — BP 100/60 | HR 56 | Temp 98.0°F | Ht 70.0 in | Wt 154.2 lb

## 2022-10-24 DIAGNOSIS — Z23 Encounter for immunization: Secondary | ICD-10-CM | POA: Diagnosis not present

## 2022-10-24 DIAGNOSIS — M25561 Pain in right knee: Secondary | ICD-10-CM

## 2022-10-24 DIAGNOSIS — M7041 Prepatellar bursitis, right knee: Secondary | ICD-10-CM

## 2022-10-24 DIAGNOSIS — R600 Localized edema: Secondary | ICD-10-CM | POA: Diagnosis not present

## 2024-04-07 ENCOUNTER — Encounter: Payer: Self-pay | Admitting: Family Medicine

## 2024-04-07 ENCOUNTER — Ambulatory Visit (INDEPENDENT_AMBULATORY_CARE_PROVIDER_SITE_OTHER): Admitting: Family Medicine

## 2024-04-07 VITALS — BP 106/58 | HR 64 | Temp 98.2°F | Ht 69.13 in | Wt 148.2 lb

## 2024-04-07 DIAGNOSIS — Z0000 Encounter for general adult medical examination without abnormal findings: Secondary | ICD-10-CM

## 2024-04-07 DIAGNOSIS — E7800 Pure hypercholesterolemia, unspecified: Secondary | ICD-10-CM | POA: Diagnosis not present

## 2024-04-07 DIAGNOSIS — Z125 Encounter for screening for malignant neoplasm of prostate: Secondary | ICD-10-CM | POA: Diagnosis not present

## 2024-04-07 DIAGNOSIS — Z7189 Other specified counseling: Secondary | ICD-10-CM

## 2024-04-07 DIAGNOSIS — Z860101 Personal history of adenomatous and serrated colon polyps: Secondary | ICD-10-CM

## 2024-04-07 LAB — COMPREHENSIVE METABOLIC PANEL WITH GFR
ALT: 16 U/L (ref 0–53)
AST: 17 U/L (ref 0–37)
Albumin: 4.3 g/dL (ref 3.5–5.2)
Alkaline Phosphatase: 92 U/L (ref 39–117)
BUN: 13 mg/dL (ref 6–23)
CO2: 29 meq/L (ref 19–32)
Calcium: 9.2 mg/dL (ref 8.4–10.5)
Chloride: 105 meq/L (ref 96–112)
Creatinine, Ser: 1.08 mg/dL (ref 0.40–1.50)
GFR: 75.02 mL/min (ref >60.00)
Glucose, Bld: 70 mg/dL (ref 70–99)
Potassium: 4 meq/L (ref 3.5–5.1)
Sodium: 141 meq/L (ref 135–145)
Total Bilirubin: 0.8 mg/dL (ref 0.2–1.2)
Total Protein: 6.7 g/dL (ref 6.0–8.3)

## 2024-04-07 LAB — LIPID PANEL
Cholesterol: 183 mg/dL (ref 0–200)
HDL: 43.5 mg/dL (ref >39.00)
LDL Cholesterol: 124 mg/dL — ABNORMAL HIGH (ref 0–99)
NonHDL: 139.82
Total CHOL/HDL Ratio: 4
Triglycerides: 81 mg/dL (ref 0.0–149.0)
VLDL: 16.2 mg/dL (ref 0.0–40.0)

## 2024-04-07 LAB — PSA: PSA: 0.58 ng/mL (ref 0.10–4.00)

## 2024-04-07 NOTE — Progress Notes (Unsigned)
 CPE- See plan.  Routine anticipatory guidance given to patient.  See health maintenance.  The possibility exists that previously documented standard health maintenance information may have been brought forward from a previous encounter into this note.  If needed, that same information has been updated to reflect the current situation based on today's encounter.    Tetanus 2019 Flu shot yearly.   PNA d/w pt.   Shingles d/w pt.  Advised to check on coverage.   Covid vaccine done.   Possible/unknown CA hx with his father.  PSA pending 2025.  Colonoscopy 2016, per GI due 2023. Letter given to patient.   HCV and HIV screening prev done. Diet and exercise d/w pt, doing well on both per patient report.   Living will d/w pt.  Wife designated if patient were incapacitated.   Then son designated if needed.    Labs pending.  H/o mild LDL elevation.    PMH and SH reviewed  Meds, vitals, and allergies reviewed.   ROS: Per HPI.  Unless specifically indicated otherwise in HPI, the patient denies:  General: fever. Eyes: acute vision changes ENT: sore throat Cardiovascular: chest pain Respiratory: SOB GI: vomiting GU: dysuria Musculoskeletal: acute back pain Derm: acute rash Neuro: acute motor dysfunction Psych: worsening mood Endocrine: polydipsia Heme: bleeding Allergy: hayfever  GEN: nad, alert and oriented HEENT: mucous membranes moist NECK: supple w/o LA CV: rrr. PULM: ctab, no inc wob ABD: soft, +bs EXT: no edema SKIN: no acute rash

## 2024-04-07 NOTE — Patient Instructions (Addendum)
 I would get a flu shot each fall.   Check with your insurance to see if they will cover the shingles shot. It would be reasonable to get the pneumonia shot at some point.   Go to the lab on the way out.   If you have mychart we'll likely use that to update you.    Take care.  Glad to see you. Please call about seeing GI.

## 2024-04-08 ENCOUNTER — Ambulatory Visit: Payer: Self-pay | Admitting: Family Medicine

## 2024-04-08 NOTE — Assessment & Plan Note (Signed)
 Living will d/w pt.  Wife designated if patient were incapacitated.   Then son designated if needed.

## 2024-04-08 NOTE — Assessment & Plan Note (Signed)
 Tetanus 2019 Flu shot yearly.   PNA d/w pt.   Shingles d/w pt.  Advised to check on coverage.   Covid vaccine done.   Possible/unknown CA hx with his father.  PSA pending 2025.  Colonoscopy 2016, per GI due 2023. Letter given to patient.   HCV and HIV screening prev done. Diet and exercise d/w pt, doing well on both per patient report.   Living will d/w pt.  Wife designated if patient were incapacitated.   Then son designated if needed.    Labs pending.  H/o mild LDL elevation.

## 2024-04-08 NOTE — Assessment & Plan Note (Signed)
 Colonoscopy 2016, per GI due 2023. Letter given to patient.  I asked him to call about follow-up.

## 2024-06-22 ENCOUNTER — Telehealth: Payer: Self-pay | Admitting: Family Medicine

## 2024-06-22 NOTE — Telephone Encounter (Signed)
 Spouse dropped off form to be filled out by provider. Please call when ready for pick up.

## 2024-06-29 ENCOUNTER — Telehealth: Payer: Self-pay

## 2024-06-29 NOTE — Telephone Encounter (Signed)
 Reached out to patient and left message for him to return call to office. If the patient calls please ask what is his waist circumference.

## 2024-07-02 NOTE — Telephone Encounter (Signed)
 Left voicemail for patient to return call to office.

## 2024-07-06 NOTE — Telephone Encounter (Signed)
 Called patient to obtain his waist circumference. Unable to leave a voicemail, mailbox full

## 2024-07-13 NOTE — Telephone Encounter (Signed)
 Pt's wife came in for her appt and asked about the form, she needs it to be turned in on the month of November.
